# Patient Record
Sex: Female | Born: 1980 | Race: White | Hispanic: No | Marital: Single | Smoking: Current every day smoker
Health system: Southern US, Community
[De-identification: ages and names within clinical notes are randomized; demographics above are authoritative.]

## PROBLEM LIST (undated history)

## (undated) DIAGNOSIS — K8020 Calculus of gallbladder without cholecystitis without obstruction: Secondary | ICD-10-CM

## (undated) DIAGNOSIS — N83209 Unspecified ovarian cyst, unspecified side: Secondary | ICD-10-CM

## (undated) DIAGNOSIS — Z789 Other specified health status: Secondary | ICD-10-CM

## (undated) DIAGNOSIS — F1910 Other psychoactive substance abuse, uncomplicated: Secondary | ICD-10-CM

---
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
## 2002-01-10 ENCOUNTER — Emergency Department (HOSPITAL_COMMUNITY): Admission: EM | Admit: 2002-01-10 | Discharge: 2002-01-10 | Payer: Self-pay | Admitting: Emergency Medicine

## 2002-03-29 ENCOUNTER — Emergency Department (HOSPITAL_COMMUNITY): Admission: EM | Admit: 2002-03-29 | Discharge: 2002-03-29 | Payer: Self-pay | Admitting: Emergency Medicine

## 2002-04-22 ENCOUNTER — Emergency Department (HOSPITAL_COMMUNITY): Admission: EM | Admit: 2002-04-22 | Discharge: 2002-04-23 | Payer: Self-pay | Admitting: *Deleted

## 2002-04-23 ENCOUNTER — Inpatient Hospital Stay (HOSPITAL_COMMUNITY): Admission: EM | Admit: 2002-04-23 | Discharge: 2002-04-25 | Payer: Self-pay | Admitting: *Deleted

## 2002-04-26 ENCOUNTER — Emergency Department (HOSPITAL_COMMUNITY): Admission: EM | Admit: 2002-04-26 | Discharge: 2002-04-27 | Payer: Self-pay

## 2002-05-02 ENCOUNTER — Inpatient Hospital Stay (HOSPITAL_COMMUNITY): Admission: AD | Admit: 2002-05-02 | Discharge: 2002-05-03 | Payer: Self-pay | Admitting: Obstetrics & Gynecology

## 2002-06-06 ENCOUNTER — Ambulatory Visit (HOSPITAL_COMMUNITY): Admission: AD | Admit: 2002-06-06 | Discharge: 2002-06-06 | Payer: Self-pay | Admitting: Obstetrics and Gynecology

## 2002-08-07 ENCOUNTER — Ambulatory Visit (HOSPITAL_COMMUNITY): Admission: AD | Admit: 2002-08-07 | Discharge: 2002-08-07 | Payer: Self-pay | Admitting: Obstetrics and Gynecology

## 2002-08-08 ENCOUNTER — Ambulatory Visit (HOSPITAL_COMMUNITY): Admission: AD | Admit: 2002-08-08 | Discharge: 2002-08-08 | Payer: Self-pay | Admitting: Obstetrics and Gynecology

## 2002-08-19 ENCOUNTER — Ambulatory Visit (HOSPITAL_COMMUNITY): Admission: AD | Admit: 2002-08-19 | Discharge: 2002-08-20 | Payer: Self-pay | Admitting: Obstetrics & Gynecology

## 2002-08-24 ENCOUNTER — Ambulatory Visit (HOSPITAL_COMMUNITY): Admission: AD | Admit: 2002-08-24 | Discharge: 2002-08-24 | Payer: Self-pay | Admitting: Obstetrics and Gynecology

## 2002-09-01 ENCOUNTER — Ambulatory Visit (HOSPITAL_COMMUNITY): Admission: AD | Admit: 2002-09-01 | Discharge: 2002-09-01 | Payer: Self-pay | Admitting: Obstetrics and Gynecology

## 2002-09-20 ENCOUNTER — Observation Stay (HOSPITAL_COMMUNITY): Admission: AD | Admit: 2002-09-20 | Discharge: 2002-09-21 | Payer: Self-pay | Admitting: Obstetrics and Gynecology

## 2002-10-08 ENCOUNTER — Observation Stay (HOSPITAL_COMMUNITY): Admission: AD | Admit: 2002-10-08 | Discharge: 2002-10-09 | Payer: Self-pay | Admitting: Obstetrics and Gynecology

## 2002-10-17 ENCOUNTER — Inpatient Hospital Stay (HOSPITAL_COMMUNITY): Admission: AD | Admit: 2002-10-17 | Discharge: 2002-10-20 | Payer: Self-pay | Admitting: Obstetrics and Gynecology

## 2007-09-29 ENCOUNTER — Emergency Department (HOSPITAL_COMMUNITY): Admission: EM | Admit: 2007-09-29 | Discharge: 2007-09-29 | Payer: Self-pay | Admitting: Emergency Medicine

## 2007-11-22 ENCOUNTER — Emergency Department (HOSPITAL_COMMUNITY): Admission: EM | Admit: 2007-11-22 | Discharge: 2007-11-22 | Payer: Self-pay | Admitting: Emergency Medicine

## 2009-01-20 ENCOUNTER — Emergency Department (HOSPITAL_COMMUNITY): Admission: EM | Admit: 2009-01-20 | Discharge: 2009-01-20 | Payer: Self-pay | Admitting: Emergency Medicine

## 2009-06-17 ENCOUNTER — Emergency Department (HOSPITAL_COMMUNITY): Admission: EM | Admit: 2009-06-17 | Discharge: 2009-06-17 | Payer: Self-pay | Admitting: Emergency Medicine

## 2009-11-12 ENCOUNTER — Emergency Department (HOSPITAL_COMMUNITY): Admission: EM | Admit: 2009-11-12 | Discharge: 2009-11-12 | Payer: Self-pay | Admitting: Emergency Medicine

## 2010-07-09 ENCOUNTER — Emergency Department (HOSPITAL_COMMUNITY)
Admission: EM | Admit: 2010-07-09 | Discharge: 2010-07-09 | Payer: Self-pay | Source: Home / Self Care | Admitting: Emergency Medicine

## 2010-07-24 ENCOUNTER — Emergency Department (HOSPITAL_COMMUNITY): Admission: EM | Admit: 2010-07-24 | Discharge: 2010-07-24 | Payer: Self-pay | Admitting: Emergency Medicine

## 2010-11-17 LAB — POCT PREGNANCY, URINE

## 2010-12-10 LAB — URINALYSIS, ROUTINE W REFLEX MICROSCOPIC
Bilirubin Urine: NEGATIVE
Glucose, UA: NEGATIVE mg/dL
Ketones, ur: NEGATIVE mg/dL
Protein, ur: NEGATIVE mg/dL

## 2010-12-10 LAB — RAPID URINE DRUG SCREEN, HOSP PERFORMED
Amphetamines: NOT DETECTED
Benzodiazepines: NOT DETECTED
Cocaine: POSITIVE — AB
Opiates: NOT DETECTED

## 2010-12-10 LAB — BASIC METABOLIC PANEL
Chloride: 106 mEq/L (ref 96–112)
Creatinine, Ser: 0.81 mg/dL (ref 0.4–1.2)
Sodium: 137 mEq/L (ref 135–145)

## 2010-12-10 LAB — DIFFERENTIAL
Eosinophils Relative: 2 % (ref 0–5)
Lymphocytes Relative: 24 % (ref 12–46)
Lymphs Abs: 2.4 10*3/uL (ref 0.7–4.0)
Neutrophils Relative %: 65 % (ref 43–77)

## 2010-12-10 LAB — CBC
HCT: 43.2 % (ref 36.0–46.0)
Platelets: 284 10*3/uL (ref 150–400)
WBC: 10 10*3/uL (ref 4.0–10.5)

## 2010-12-10 LAB — URINE MICROSCOPIC-ADD ON

## 2010-12-10 LAB — ETHANOL

## 2011-01-22 NOTE — H&P (Signed)
   NAME:  Olivia Mcdaniel, Olivia Mcdaniel                      ACCOUNT NO.:  [id number]   MEDICAL RECORD NO.:  [id number]                   PATIENT TYPE:  INP   LOCATION:  A428                                 FACILITY:  [facility]   PHYSICIAN:  Tilda Burrow, M.D.              DATE OF BIRTH:  1981/04/03   DATE OF ADMISSION:  DATE OF DISCHARGE:                                HISTORY & PHYSICAL   ADMITTING DIAGNOSIS:  Pregnancy at 11 weeks with fever of unknown origin.   HISTORY OF PRESENT ILLNESS:  This patient had several days of malaise,  started feeling really bad, and presented to the emergency room.  At that  time she was admitted up to labor and delivery with a temperature of 101.3.  Pulse was 120.  At that time a CBC was obtained and the patient was admitted  with fever of unknown origin at [redacted] weeks gestation.   ALLERGIES:  She has no known allergies.   PAST MEDICAL HISTORY:  Negative.   PAST SURGICAL HISTORY:  Negative.   MEDICATIONS:  She is on prenatal vitamins.   DIAGNOSIS:  Fever of unknown origin.   PLAN:  CBC a urinalysis, Ancef 1 gm IVq.6h., consult Dr. Christin Bach  regarding treatment of patient and monitor patient's vital signs q.4h.     Zerita Boers, N.M.                      Tilda Burrow, M.D.    DL/MEDQ  D:  29/56/2130  T:  04/25/2002  Job:  [phone] [phone]   cc:   [facility]

## 2011-01-22 NOTE — Op Note (Signed)
   NAME:  Olivia Mcdaniel, Olivia Mcdaniel                       ACCOUNT NO.:  [id number]   MEDICAL RECORD NO.:  [id number]                   PATIENT TYPE:  INP   LOCATION:  LDR2                                 FACILITY:  [facility]   PHYSICIAN:  Tilda Burrow, M.D.              DATE OF BIRTH:  Feb 26, 1981   DATE OF PROCEDURE:  DATE OF DISCHARGE:                                 OPERATIVE REPORT   PROCEDURE:  Epidural catheter placement.   INDICATIONS:  A 30 year old primiparous female being induced at 39 weeks due  to persistent pressure _____ prodromal labor symptoms.   DETAILS OF PROCEDURE:  The patient was placed in the sitting position and  prepped and draped over the back, and a Xylocaine skin wheal raised at L2-3  interspace.  Loss of resistance technique using a 19-gauge Tuohy needle was  used to identify the epidural space at approximately 3.5 cm depth beneath  the skin. A 5 cc test dose with 1.5% Xylocaine with epinephrine was  instilled, followed by placement of the epidural catheter to a depth of 3  cm, removal of the Tuohy needle and taping of the catheter in the patient's  back and then a 5 cc bolus infused with good analgesic levels at 210  identified.  The patient is quite comfortable, and postprocedure exam shows  the cervix to be 4 cm, 50%, at -1 to -2, vertex, mid position cervix.  Scalp  electrode is applied.                                               Tilda Burrow, M.D.    JVF/MEDQ  D:  10/18/2002  T:  10/18/2002  Job:  [id number]   cc:   [facility]

## 2011-01-22 NOTE — H&P (Signed)
   NAME:  Olivia Mcdaniel, Olivia Mcdaniel NO.:  [id number]   MEDICAL RECORD NO.:  [id number]                   PATIENT TYPE:   LOCATION:                                       FACILITY:   PHYSICIAN:  Lazaro Arms, M.D.                DATE OF BIRTH:   DATE OF ADMISSION:  05/02/2002  DATE OF DISCHARGE:                                HISTORY & PHYSICAL   HISTORY OF PRESENT ILLNESS:  The patient is an 30 year old white female,  gravida 1, para 0, with an estimated date of delivery of 10/25/02 by last  menstrual period and confirmatory eight-week sonogram, currently at 14 weeks  6 days' gestation, who presents to the office this afternoon complaining of  not being able to keep anything down for a couple of days and a bilateral  frontal and bitemporal headache and nausea associated with that.  She has a  documented five-pound weight loss over the past two weeks and has weighed a  few times in the office.  As a result, she is admitted to the hospital for  IV hydration.   PAST MEDICAL HISTORY:  Negative.   PAST SURGICAL HISTORY:  Negative.   PAST OBSTETRICAL HISTORY:  Negative.   ALLERGIES:  None.   MEDICATIONS:  Prenatal vitamins.   PHYSICAL EXAMINATION:  ABDOMEN:  Benign, no CVA tenderness.  Fetal heart  rate is 160.  PELVIC:  Deferred.   IMPRESSION:  1. Intrauterine pregnancy at 14 weeks 6 days.  2. Hyperemesis gravidarum.  3. Bilateral frontal headache.   PLAN:  The patient is admitted for IV hydration, NPO, and management of  headache status.  She will probably be in approximately 48 hours or so, and  will keep her NPO for now.                                               Lazaro Arms, M.D.    Loraine Maple  D:  05/02/2002  T:  05/02/2002  Job:  [id number]

## 2011-01-22 NOTE — Discharge Summary (Signed)
   NAME:  Olivia Mcdaniel, Olivia Mcdaniel                      ACCOUNT NO.:  [id number]   MEDICAL RECORD NO.:  [id number]                   PATIENT TYPE:  INP   LOCATION:  A428                                 FACILITY:  [facility]   PHYSICIAN:  Tilda Burrow, M.D.              DATE OF BIRTH:  1981/05/03   DATE OF ADMISSION:  DATE OF DISCHARGE:  04/25/2002                                 DISCHARGE SUMMARY   DISCHARGE HISTORY AND PHYSICAL:   ADMITTING DIAGNOSIS:  Pregnancy at 11 weeks with fever of unknown origin.   DISCHARGE DIAGNOSES:  1. Strep throat.  2. Tonsillitis.   HOSPITAL COURSE:  This patient was admitted on 08/18 at approximately 11  p.m. at night with a fever of 101.3, malaise, body pains, vomiting, and  nausea.  She was placed on IV Ancef.  CBC was obtained and was within normal  limits, but continued to run a temperature of 101-102 for the next 24 hours.  Upon assessment this morning she was noted to have swollen lymph nodes in  the neck, red inflamed tonsils, with white patches and a red throat with  white patches.  Strep culture was obtained.  The patient was given 1.2  million units of penicillin IM.   She has remained afebrile during the course of the day and desires to go  home this afternoon.  It is now 1735 and the patient is to be discharged.  Vital signs have been stable and CBC is within normal limits.   DISCHARGE INSTRUCTIONS:  She is to monitor her temperature and she is to  follow up in the office in 1 week.  She is to use salt water gargles and  Chloraseptic spray for her throat.   DISCHARGE MEDICATIONS:  Her prenatal vitamins.  She has not required any  medication during the course of the day for pain.     Zerita Boers, Reita Cliche, M.D.    DL/MEDQ  D:  16/06/9603  T:  04/25/2002  Job:  [id number]   cc:   [facility]

## 2011-01-22 NOTE — H&P (Signed)
   NAME:  Olivia Mcdaniel, PERRYMAN NO.:  [id number]   MEDICAL RECORD NO.:  [id number]                  PATIENT TYPE:   LOCATION:                                       FACILITY:   PHYSICIAN:  Tilda Burrow, M.D.              DATE OF BIRTH:   DATE OF ADMISSION:  10/17/2002  DATE OF DISCHARGE:                                HISTORY & PHYSICAL   REASON FOR ADMISSION:  Pregnancy at 38 weeks and 6 days for elective  induction of labor due to maternal discomfort.   HISTORY OF PRESENT ILLNESS:  After through discussion of risks and benefits  of elective induction the patient desires an assist on proceeding with  elective induction of labor.   PAST MEDICAL HISTORY:  Medical history is negative.   PAST SURGICAL HISTORY:  Surgical history is negative.   PRENATAL COURSE:  Essentially uneventful.  Blood type is A positive.  Antibody screen is negative.  Pap was within normal limits.  Rubella is not  immune.  VDRL is nonreactive.  Hepatitis B surface antigen negative.  HIV is  negative.  UDS was negative.  GBS is negative.   PHYSICAL EXAMINATION:  VITAL SIGNS:  Physical exam today, weight is 145  pounds.  Blood pressure is 110/58.  ABDOMEN:  Fetal heart tones are 140, strong and regular.  Fundal height is  38 cm.  PELVIC:  Cervix is 1 cm, 50% effaced, minus 2 station.   PLAN:  We are going to admit for Foley bulb induction and Pitocin induction  of labor.     Zerita Boers, Reita Cliche, M.D.    DL/MEDQ  D:  38/75/6433  T:  10/17/2002  Job:  [id number]   cc:   [facility]

## 2011-01-22 NOTE — Discharge Summary (Signed)
   NAME:  Olivia Mcdaniel, Olivia Mcdaniel                       ACCOUNT NO.:  [id number]   MEDICAL RECORD NO.:  [id number]                   PATIENT TYPE:  OIB   LOCATION:  A414                                 FACILITY:  [facility]   PHYSICIAN:  Tilda Burrow, M.D.              DATE OF BIRTH:  12-06-1980   DATE OF ADMISSION:  09/20/2002  DATE OF DISCHARGE:  09/21/2002                                 DISCHARGE SUMMARY   OBSERVATION SUMMARY:  Observation 9 p.m., January 15 to 9 a.m., September 21, 2002 (12 hours).   HISTORY OF PRESENT ILLNESS AND HOSPITAL COURSE:  This 30 year old female,  gravida 1, para 0, LMP Jan 18, 2002, placing menstrual [facility] October 25, 2002, with corresponding ultrasounds x3, is admitted after a pregnancy  course followed through our office and notable for multiple somatic  complaints and regular visits to our office.  The patient presents at 9 p.m.  on September 20, 2002, in apparent extreme discomfort at 35+ weeks' gestation,  complaining of pelvic pressure suprapubic area.  External fetal monitoring  shows a healthy reactive fetal heart rate tracing and mild uterine  irritability, which evolved into indentable mild contraction pattern of  irregular every-1-to-2-minute frequency.  Urinalysis shows evidence of  urinary tract infection.  Cervix was closed.  Due to the severity of her  dramatic presentation a D-dimer was ordered which was mildly elevated at  1.27 with normal range up to 0.48, considered not clinically significant.  The patient has no evidence of bleeding or recent history of trauma or  injury.  The patient will receive IV fluids, two doses of subcutaneous  terbutaline tocolysis, and antibiotic therapy consisting of Ancef IV every  six hours x2 doses.  The following morning the patient was sleeping soundly  with complete resolution of contractions.   DISCHARGE MEDICATIONS:  She is discharged home on the following medications:  1. Macrobid 100 mg p.o. b.i.d. x7  days.  2. Tylenol No. 3 one to two q.4h. p.r.n. pain.   ACTIVITY:  Limited activity through the weekend.   FOLLOW-UP:  Follow up as scheduled in our office in one week.                                               Tilda Burrow, M.D.    JVF/MEDQ  D:  09/21/2002  T:  09/21/2002  Job:  [id number]

## 2011-01-22 NOTE — Op Note (Signed)
NAME:  KRYSTYN, PICKING                       ACCOUNT NO.:  [id number]   MEDICAL RECORD NO.:  [id number]                   PATIENT TYPE:  INP   LOCATION:  A428                                 FACILITY:  [facility]   PHYSICIAN:  Tilda Burrow, M.D.              DATE OF BIRTH:  August 09, 1981   DATE OF PROCEDURE:  DATE OF DISCHARGE:                                 OPERATIVE REPORT   DELIVERY NOTE:  The patient had a small anterior lip at +1 station at  approximately 1900.  The fetal heart rate was within the 160s with variable  decelerations down to the 100s-100s with each contraction, so it was decided  to allow her to push.  After approximately 45 minutes of pushing effort with  no descent whatsoever, the epidural was discontinued.  The fetus was  suspected to be in an ROP position.  After an hour of pushing with minimal  descent, the patient did take a 30-minute break from pushing, after which  the baby was noted to be lower in the pelvis.  She continued to push for  another hour and brought the baby down to crowning position.  A vacuum was  offered, accepted, and utilized to help expedite the delivery due to ROP  position and decreased maternal pushing effort.  After three vacuum attempts  with the Kiwi vacuum popping off, it was discontinued.  A midline episiotomy  was cut.  The patient still had quite poor pushing efforts, so it was about  another 20 minutes before she finally delivered a viable female infant in ROP  position.  The mouth and nose were suctioned with the DeLee on the perineum,  a nuchal cord was reduced, and the baby was delivered at 2133.  No  stimulating efforts were made because of the meconium-stained fluid and the  baby taken directly to the warmer.  The trachea was then suctioned a total  of three times with approximately 6 mL of meconium-stained mucus aspirated.  At that point resuscitative efforts were made on the baby.  Please see the  nursing notes for the  resuscitation.  Apgars were 4 and 8, weight 7 pounds  12 ounces.  The placenta delivered spontaneously after a spontaneous  separation at 2139.  It was inspected and noted to be intact with a three-  vessel cord and meconium stain.  It was sent to pathology.  The vagina was  then inspected and no extensions of the episiotomy were noted.  It was  repaired with a 2-0 Vicryl under epidural anesthesia.  The fundus had some  periods of atony, and Hemabate 125 mcg was given IM.  The uterus then was  firm.  Twenty units of Pitocin diluted in 1000 mL of lactated Ringer's had  already been hung and was infusing rapidly IV.  Estimated blood loss 400 mL.  The epidural catheter was then removed with the blue tip visualized as  being  intact.     Jacklyn Shell, C.N.M.          Tilda Burrow, M.D.    FC/MEDQ  D:  10/19/2002  T:  10/19/2002  Job:  [id number]

## 2011-01-22 NOTE — Discharge Summary (Signed)
   NAME:  PAGE, PUCCIARELLI                       ACCOUNT NO.:  [id number]   MEDICAL RECORD NO.:  [id number]                   PATIENT TYPE:  INP   LOCATION:  A416                                 FACILITY:  [facility]   PHYSICIAN:  Tilda Burrow, M.D.              DATE OF BIRTH:  [birth year]   DATE OF ADMISSION:  05/02/2002  DATE OF DISCHARGE:  05/03/2002                                 DISCHARGE SUMMARY   ADMITTING DIAGNOSIS:  Pregnancy at 14 weeks with nausea and vomiting and  dehydration.   DISCHARGE DIAGNOSES:  Pregnancy at 14 weeks with nausea and vomiting and  dehydration.   HOSPITAL COURSE:  Hospital course essentially uneventful.  The patient  responded well to IV therapy with some Kay Ciel due to a potassium level of  3.3.  She was taking p.o. fluids well, had no nausea, vomiting or headaches  during the course of the night.  She slept well, retained p.o. fluids in the  a.m. and is now ready for discharge.  Vital signs are stable.  The patient  is keeping fluids down well.   PLAN:  We are going to discharge home.   FOLLOWUP:  Follow up in one week at the office.   DISCHARGE MEDICATIONS:  1. Zofran 8 mg p.o. b.i.d.  2. Darvocet-N 100 two p.o. q.4h. p.r.n. headache.     Zerita Boers, Reita Cliche, M.D.    DL/MEDQ  D:  16/06/9603  T:  05/03/2002  Job:  [id number]   cc:   [facility]

## 2012-05-28 ENCOUNTER — Emergency Department (HOSPITAL_COMMUNITY): Payer: Self-pay

## 2012-05-28 ENCOUNTER — Emergency Department (HOSPITAL_COMMUNITY)
Admission: EM | Admit: 2012-05-28 | Discharge: 2012-05-29 | Discharge status: Against Medical Advice | Payer: Self-pay | Attending: Emergency Medicine | Admitting: Emergency Medicine

## 2012-05-28 DIAGNOSIS — T43601A Poisoning by unspecified psychostimulants, accidental (unintentional), initial encounter: Secondary | ICD-10-CM | POA: Insufficient documentation

## 2012-05-28 DIAGNOSIS — T405X1A Poisoning by cocaine, accidental (unintentional), initial encounter: Secondary | ICD-10-CM | POA: Insufficient documentation

## 2012-05-28 DIAGNOSIS — E876 Hypokalemia: Secondary | ICD-10-CM | POA: Insufficient documentation

## 2012-05-28 DIAGNOSIS — R0602 Shortness of breath: Secondary | ICD-10-CM | POA: Insufficient documentation

## 2012-05-28 LAB — COMPREHENSIVE METABOLIC PANEL
CO2: 27 mEq/L (ref 19–32)
Calcium: 9.6 mg/dL (ref 8.4–10.5)
Creatinine, Ser: 0.63 mg/dL (ref 0.50–1.10)
GFR calc Af Amer: >90 mL/min (ref >90)
GFR calc non Af Amer: >90 mL/min (ref >90)
Glucose, Bld: 105 mg/dL — ABNORMAL HIGH (ref 70–99)

## 2012-05-28 LAB — CBC WITH DIFFERENTIAL/PLATELET
Eosinophils Relative: 2 % (ref 0–5)
HCT: 42.3 % (ref 36.0–46.0)
Lymphocytes Relative: 14 % (ref 12–46)
Lymphs Abs: 1.8 10*3/uL (ref 0.7–4.0)
MCV: 92.4 fL (ref 78.0–100.0)
Monocytes Absolute: 1.3 10*3/uL — ABNORMAL HIGH (ref 0.1–1.0)
Monocytes Relative: 11 % (ref 3–12)
RBC: 4.58 MIL/uL (ref 3.87–5.11)
WBC: 12.3 10*3/uL — ABNORMAL HIGH (ref 4.0–10.5)

## 2012-05-28 LAB — PREGNANCY, URINE: Preg Test, Ur: NEGATIVE

## 2012-05-28 LAB — RAPID URINE DRUG SCREEN, HOSP PERFORMED
Amphetamines: NOT DETECTED
Barbiturates: NOT DETECTED

## 2012-05-28 LAB — ETHANOL: Alcohol, Ethyl (B): <11 mg/dL (ref 0–11)

## 2012-05-28 MED ORDER — POTASSIUM CHLORIDE CRYS ER 20 MEQ PO TBCR
40.0000 meq | EXTENDED_RELEASE_TABLET | Freq: Once | ORAL | Status: AC
  Administered 2012-05-28: 40 meq via ORAL
  Filled 2012-05-28: qty 1
  Filled 2012-05-28: qty 2

## 2012-05-28 MED ORDER — ASPIRIN 325 MG PO TABS
325.0000 mg | ORAL_TABLET | Freq: Once | ORAL | Status: AC
  Administered 2012-05-28: 325 mg via ORAL
  Filled 2012-05-28: qty 1

## 2012-05-28 MED ORDER — POTASSIUM CHLORIDE 10 MEQ/100ML IV SOLN
10.0000 meq | INTRAVENOUS | Status: AC
  Administered 2012-05-28 – 2012-05-29 (×3): 10 meq via INTRAVENOUS
  Filled 2012-05-28 (×3): qty 100

## 2012-05-28 NOTE — ED Notes (Signed)
Pt presents with no acute distress.  Per [facility]- Pt ingested 1 gram of cocaine nasal ingestion last at 1630.  #18 LAC saline lock.

## 2012-05-28 NOTE — ED Notes (Signed)
Pt states she bought a gram of coke from an unknown source at appx 1700. Pt states after an hour she begun to feel chest tightness and SOB that she hasn't felt with previous cocaine use. Pt. States she still feels some tightness and is SOB. O2 sats are 98%/RA. Boyfriend at bedside.

## 2012-05-29 LAB — TROPONIN I
Troponin I: <0.3 ng/mL (ref <0.30)
Troponin I: <0.3 ng/mL (ref <0.30)

## 2012-05-29 NOTE — ED Provider Notes (Signed)
Medical screening examination/treatment/procedure(s) were performed by non-physician practitioner and as supervising physician I was immediately available for consultation/collaboration.  I saw the patient with Tiffany the PA.   Olivia Mcdaniel is a 31 y.o. female who took 1 gram of cocaine prior to arrival and subsequently developed substernal chest pain. Chest pain resolved on arrival. EKG nl. CXR nl, CBC, CMP nl except K of 2.7. Trop neg x 1. I discussed at length with patient and her boyfriend that she will require 6 hr trop and potassium supplementation prior to leaving. She received ASA, KDur, and KCl runs. I signed out to Dr. Read Drivers at midnight and told him that she can be discharged if 6 hr trop neg and repeat K nl. Tiffany the PA will carry out the plan.   For detail note, please see the PA note.    Richardean Canal, MD 05/29/12 [phone]

## 2012-05-29 NOTE — ED Notes (Signed)
Pt states her ride is here and she needs to leave  Encouraged pt to stay and get final lab results  Pt refused stating she needed to leave  Neva Seat, [place] notified  Pt signed out AMA after risks were explained to pt

## 2012-05-29 NOTE — ED Provider Notes (Signed)
History     CSN: [id number]  Arrival date & time 05/28/12  2013   First MD Initiated Contact with Patient 05/28/12 2047      Chief Complaint  Patient presents with  . Drug Overdose    1 gram of cocaine nasal ingestion 1630    (Consider location/radiation/quality/duration/timing/severity/associated sxs/prior treatment) HPI  Department by EMS after an overdose of cocaine. She says that she started a whole gram of cocaine and then developed chest pain and was unable to breathe. Upon arriving to the emergency department she was no longer having chest pain, her blood pressure was within normal limits, her oxygen saturation was within normal limits and respirations were within normal limits. She asks to leave but we advise her that she needs to stay and be worked up/ she denies HI or SI and denies using any other substances. No past medical history on file.  No past surgical history on file.  No family history on file.  History  Substance Use Topics  . Smoking status: Not on file  . Smokeless tobacco: Not on file  . Alcohol Use: Not on file    OB History    No data available      Review of Systems   Review of Systems  Gen: no weight loss, fevers, chills, night sweats  Eyes: no discharge or drainage, no occular pain or visual changes  Nose: no epistaxis or rhinorrhea  Mouth: no dental pain, no sore throat  Neck: no neck pain  Lungs:No wheezing, coughing or hemoptysis CV: + chest pain, - palpitations, dependent edema or orthopnea  Abd: no abdominal pain, nausea, vomiting  GU: no dysuria or gross hematuria  MSK:  No abnormalities  Neuro: no headache, no focal neurologic deficits  Skin: no abnormalities Psyche: negative.    Allergies  Review of patient's allergies indicates no known allergies.  Home Medications  No current outpatient prescriptions on file.  BP 90/60  Pulse 76  Temp 99.1 F (37.3 C) (Oral)  Resp 18  Ht 5\' 3"  (1.6 m)  Wt 145 lb (65.772 kg)   BMI 25.69 kg/m2  SpO2 100%  LMP 05/19/2012  Physical Exam  Nursing note and vitals reviewed. Constitutional: She appears well-developed and well-nourished. No distress.  HENT:  Head: Normocephalic and atraumatic.  Mouth/Throat: Dental caries (wide spread tooth decay) present.  Eyes: Pupils are equal, round, and reactive to light.  Neck: Normal range of motion. Neck supple.  Cardiovascular: Normal rate, regular rhythm and normal heart sounds.   No murmur heard. Pulmonary/Chest: Effort normal. No respiratory distress. She has no wheezes. She has no rales.  Abdominal: Soft. She exhibits no distension. There is no tenderness. There is no rebound.  Neurological: She is alert.  Skin: Skin is warm and dry.    ED Course  Procedures (including critical care time)  Labs Reviewed  CBC WITH DIFFERENTIAL - Abnormal; Notable for the following:    WBC 12.3 (*)     Hemoglobin 15.1 (*)     Neutro Abs 9.0 (*)     Monocytes Absolute 1.3 (*)     All other components within normal limits  COMPREHENSIVE METABOLIC PANEL - Abnormal; Notable for the following:    Potassium 2.7 (*)     Glucose, Bld 105 (*)     BUN <3 (*)  REPEATED TO VERIFY   All other components within normal limits  URINE RAPID DRUG SCREEN (HOSP PERFORMED) - Abnormal; Notable for the following:    Cocaine POSITIVE (*)  All other components within normal limits  TROPONIN I  ETHANOL  PREGNANCY, URINE  TROPONIN I  TROPONIN I  POTASSIUM   Dg Chest 2 View  05/28/2012  *RADIOLOGY REPORT*  Clinical Data: 31 year old female with chest pain and shortness of breath.  CHEST - 2 VIEW  Comparison: 09/29/2007.  Findings: Normal cardiac size and mediastinal contours.  Visualized tracheal air column is within normal limits.  Lung volumes are stable and within normal limits.  The lungs remain clear. No acute osseous abnormality identified.  IMPRESSION: Negative, no acute cardiopulmonary abnormality.   Original Report Authenticated By: Harley Hallmark, M.D.      1. Hypokalemia   2. Cocaine overdose       MDM   Date: 05/29/2012  Rate: 77  Rhythm: normal sinus rhythm  QRS Axis: normal  Intervals: normal  ST/T Wave abnormalities: normal  Conduction Disutrbances:none  Narrative Interpretation:   Old EKG Reviewed: none available  CRITICAL CARE Performed by: Dorthula Matas   Total critical care time: 30 min  Critical care time was exclusive of separately billable procedures and treating other patients.  Critical care was necessary to treat or prevent imminent or life-threatening deterioration.  Critical care was time spent personally by me on the following activities: development of treatment plan with patient and/or surrogate as well as nursing, discussions with consultants, evaluation of patient's response to treatment, examination of patient, obtaining history from patient or surrogate, ordering and performing treatments and interventions, ordering and review of laboratory studies, ordering and review of radiographic studies, pulse oximetry and re-evaluation of patient's condition.   Patient has hypokalemia of 2.7 on top of cocaine chest. Patientgiven PO and 2 runs of IV potassium before signing out AMA. She had two negative troponins and negative EKG. Dr. Silverio Lay was involved in the care of this patient, pt sober and stable before leaving.        Dorthula Matas, PA 05/29/12 [phone]

## 2012-05-29 NOTE — ED Provider Notes (Signed)
Medical screening examination/treatment/procedure(s) were performed by non-physician practitioner and as supervising physician I was immediately available for consultation/collaboration.   Hussain Maimone L Murlene Revell, MD 05/29/12 0632 

## 2012-10-05 ENCOUNTER — Emergency Department (HOSPITAL_COMMUNITY)
Admission: EM | Admit: 2012-10-05 | Discharge: 2012-10-06 | Disposition: A | Discharge status: Home / Self Care | Payer: Self-pay | Attending: Emergency Medicine | Admitting: Emergency Medicine

## 2012-10-05 ENCOUNTER — Encounter (HOSPITAL_COMMUNITY): Payer: Self-pay | Admitting: *Deleted

## 2012-10-05 DIAGNOSIS — R079 Chest pain, unspecified: Secondary | ICD-10-CM | POA: Insufficient documentation

## 2012-10-05 DIAGNOSIS — R0602 Shortness of breath: Secondary | ICD-10-CM | POA: Insufficient documentation

## 2012-10-05 DIAGNOSIS — J069 Acute upper respiratory infection, unspecified: Secondary | ICD-10-CM | POA: Insufficient documentation

## 2012-10-05 DIAGNOSIS — R062 Wheezing: Secondary | ICD-10-CM | POA: Insufficient documentation

## 2012-10-05 DIAGNOSIS — J3489 Other specified disorders of nose and nasal sinuses: Secondary | ICD-10-CM | POA: Insufficient documentation

## 2012-10-05 DIAGNOSIS — F1410 Cocaine abuse, uncomplicated: Secondary | ICD-10-CM | POA: Insufficient documentation

## 2012-10-05 NOTE — ED Notes (Addendum)
Two days of cough and congestion, pt texting during conversation

## 2012-10-05 NOTE — ED Notes (Signed)
Pt requesting something to drink, aware she needs to be seen first by the PA

## 2012-10-05 NOTE — ED Provider Notes (Signed)
History  Scribed for Marlon Pel, PA-C/ Raeford Razor, MD, the patient was seen in room WTR7/WTR7. This chart was scribed by Candelaria Stagers. The patient's care started at 11:39 PM   CSN: [id number]  Arrival date & time 10/05/12  2311   None     Chief Complaint  Patient presents with  . Cough     The history is provided by the patient. No language interpreter was used.   Olivia Mcdaniel is a 32 y.o. female who presents to the Emergency Department complaining of cough, SOB, and congestion that started about three days ago.  Pt is also experiencing chest tenderness associated with her cough.  Taking deep breaths makes the pain worse.  She denies nasal congestion, sore throat, fever, or chills.  She has taken nothing for the sx.    History reviewed. No pertinent past medical history.  History reviewed. No pertinent past surgical history.  No family history on file.  History  Substance Use Topics  . Smoking status: Not on file  . Smokeless tobacco: Not on file  . Alcohol Use: Yes    OB History    Grav Para Term Preterm Abortions TAB SAB Ect Mult Living                  Review of Systems  Constitutional: Negative for fever and chills.  HENT: Positive for congestion. Negative for sore throat.   Respiratory: Positive for cough and shortness of breath.   Cardiovascular: Positive for chest pain (associated with cough ).  Gastrointestinal: Negative for nausea and vomiting.  Skin: Negative for rash.  Neurological: Negative for weakness.  All other systems reviewed and are negative.    Allergies  Review of patient's allergies indicates no known allergies.  Home Medications   Current Outpatient Rx  Name  Route  Sig  Dispense  Refill  . GUAIFENESIN-DM 100-10 MG/5ML PO SYRP   Oral   Take 5 mLs by mouth 3 (three) times daily as needed for cough.   118 mL   0     BP 108/69  Pulse 84  Temp 98.8 F (37.1 C) (Oral)  Resp 20  SpO2 100%  Physical Exam  Nursing  note and vitals reviewed. Constitutional: She is oriented to person, place, and time. She appears well-developed and well-nourished. No distress.  HENT:  Head: Normocephalic and atraumatic.  Eyes: EOM are normal.  Neck: Neck supple. No tracheal deviation present.  Cardiovascular: Normal rate and regular rhythm.   Pulmonary/Chest: Effort normal. No respiratory distress. She has wheezes (very mild wheezes on expiration).  Abdominal: Soft. There is no tenderness.  Musculoskeletal: Normal range of motion.  Neurological: She is alert and oriented to person, place, and time.  Skin: Skin is warm and dry.  Psychiatric: She has a normal mood and affect. Her behavior is normal.    ED Course  Procedures  DIAGNOSTIC STUDIES: Oxygen Saturation is 100% on room air, normal by my interpretation.    COORDINATION OF CARE:   11:40 PM Will prescribe albuterol inhaler and cough medicine.  Pt understands and agrees.    Labs Reviewed - No data to display No results found.   1. URI (upper respiratory infection)       MDM  Pt requests inhaler and medication to help her sleep.  Will give her Albuterol inhaler in ED and Robitussin DM. She ha a history of drug abuse so I want to stay away from narcotics or sleep aids.  Pt exam  is benign  I personally performed the services described in this documentation, which was scribed in my presence. The recorded information has been reviewed and is accurate.        Dorthula Matas, PA 10/06/12 0006

## 2012-10-06 MED ORDER — ALBUTEROL SULFATE HFA 108 (90 BASE) MCG/ACT IN AERS
2.0000 | INHALATION_SPRAY | Freq: Four times a day (QID) | RESPIRATORY_TRACT | Status: DC
  Administered 2012-10-06: 2 via RESPIRATORY_TRACT
  Filled 2012-10-06: qty 6.7

## 2012-10-06 MED ORDER — GUAIFENESIN 100 MG/5ML PO SOLN
5.0000 mL | Freq: Once | ORAL | Status: AC
  Administered 2012-10-06: 100 mg via ORAL
  Filled 2012-10-06: qty 5

## 2012-10-06 MED ORDER — GUAIFENESIN-DM 100-10 MG/5ML PO SYRP: 5.0000 mL | ORAL_SOLUTION | Freq: Three times a day (TID) | ORAL | Status: DC | PRN

## 2012-10-06 NOTE — ED Provider Notes (Signed)
Medical screening examination/treatment/procedure(s) were performed by non-physician practitioner and as supervising physician I was immediately available for consultation/collaboration.  Channah Godeaux M Damon Hargrove, MD 10/06/12 0738 

## 2012-10-15 ENCOUNTER — Emergency Department (HOSPITAL_COMMUNITY): Payer: Self-pay

## 2012-10-15 ENCOUNTER — Emergency Department (HOSPITAL_COMMUNITY)
Admission: EM | Admit: 2012-10-15 | Discharge: 2012-10-15 | Disposition: A | Discharge status: Home / Self Care | Payer: Self-pay | Attending: Emergency Medicine | Admitting: Emergency Medicine

## 2012-10-15 ENCOUNTER — Encounter (HOSPITAL_COMMUNITY): Payer: Self-pay | Admitting: Family Medicine

## 2012-10-15 DIAGNOSIS — F1410 Cocaine abuse, uncomplicated: Secondary | ICD-10-CM | POA: Insufficient documentation

## 2012-10-15 DIAGNOSIS — Z79899 Other long term (current) drug therapy: Secondary | ICD-10-CM | POA: Insufficient documentation

## 2012-10-15 DIAGNOSIS — F14129 Cocaine abuse with intoxication, unspecified: Secondary | ICD-10-CM

## 2012-10-15 DIAGNOSIS — R51 Headache: Secondary | ICD-10-CM | POA: Insufficient documentation

## 2012-10-15 LAB — CBC WITH DIFFERENTIAL/PLATELET
Basophils Absolute: 0 10*3/uL (ref 0.0–0.1)
Lymphocytes Relative: 15 % (ref 12–46)
Lymphs Abs: 1.5 10*3/uL (ref 0.7–4.0)
Neutrophils Relative %: 76 % (ref 43–77)
Platelets: 349 10*3/uL (ref 150–400)
RBC: 4.22 MIL/uL (ref 3.87–5.11)
RDW: 14.1 % (ref 11.5–15.5)
WBC: 9.9 10*3/uL (ref 4.0–10.5)

## 2012-10-15 LAB — BASIC METABOLIC PANEL
Calcium: 9 mg/dL (ref 8.4–10.5)
Creatinine, Ser: 0.58 mg/dL (ref 0.50–1.10)
GFR calc Af Amer: >90 mL/min (ref >90)
GFR calc non Af Amer: >90 mL/min (ref >90)
Sodium: 136 mEq/L (ref 135–145)

## 2012-10-15 LAB — POCT I-STAT TROPONIN I: Troponin i, poc: 0 ng/mL (ref 0.00–0.08)

## 2012-10-15 MED ORDER — LORAZEPAM 2 MG/ML IJ SOLN
1.0000 mg | Freq: Once | INTRAMUSCULAR | Status: AC
  Administered 2012-10-15: 1 mg via INTRAVENOUS
  Filled 2012-10-15: qty 1

## 2012-10-15 MED ORDER — ALBUTEROL SULFATE HFA 108 (90 BASE) MCG/ACT IN AERS: 2.0000 | INHALATION_SPRAY | Freq: Four times a day (QID) | RESPIRATORY_TRACT | Status: AC | PRN

## 2012-10-15 MED ORDER — ACETAMINOPHEN 325 MG PO TABS
650.0000 mg | ORAL_TABLET | Freq: Once | ORAL | Status: AC
  Administered 2012-10-15: 650 mg via ORAL
  Filled 2012-10-15: qty 2

## 2012-10-15 NOTE — ED Notes (Addendum)
Upon entering the room pt was sleeping on right side. Pt was awaken for introduction and pain assessment. Pt states her whole body hurts and was hurting before she came into the ED today. Pt was in no apparent distress and delay explained.

## 2012-10-15 NOTE — ED Notes (Signed)
Per EMS, pt had some crack/cocaine earlier today and a few hours later started having chest pain. Chest pain was relieved upon arrival here but she is complaining of headache.

## 2012-10-15 NOTE — ED Provider Notes (Signed)
History     CSN: [id number]  Arrival date & time 10/15/12  1751   First MD Initiated Contact with Patient 10/15/12 1814      Chief Complaint  Patient presents with  . Chest Pain    (Consider location/radiation/quality/duration/timing/severity/associated sxs/prior treatment) Patient is a 32 y.o. female presenting with chest pain. The history is provided by the patient.  Chest Pain Pain location:  Substernal area Pain quality: aching   Pain radiates to:  Does not radiate Pain radiates to the back: no   Pain severity:  Mild Onset quality:  Gradual Timing:  Constant Progression:  Unchanged Chronicity:  New Context: drug use   Relieved by:  Nothing Worsened by:  Nothing tried Ineffective treatments:  None tried Associated symptoms: no abdominal pain, no back pain, no fatigue, no fever, no headache, no nausea, no palpitations, no shortness of breath, not vomiting and no weakness   Associated symptoms comment:  "pain all over." Risk factors comment:  Crack cocaine use   History reviewed. No pertinent past medical history.  History reviewed. No pertinent past surgical history.  History reviewed. No pertinent family history.  History  Substance Use Topics  . Smoking status: Not on file  . Smokeless tobacco: Not on file  . Alcohol Use: Yes    OB History   Grav Para Term Preterm Abortions TAB SAB Ect Mult Living                  Review of Systems  Constitutional: Negative for fever and fatigue.  HENT: Negative for congestion, rhinorrhea and postnasal drip.   Eyes: Negative for photophobia and visual disturbance.  Respiratory: Negative for chest tightness, shortness of breath and wheezing.   Cardiovascular: Positive for chest pain. Negative for palpitations and leg swelling.  Gastrointestinal: Negative for nausea, vomiting, abdominal pain and diarrhea.  Genitourinary: Negative for urgency, frequency and difficulty urinating.  Musculoskeletal: Negative for back pain  and arthralgias.  Skin: Negative for rash and wound.  Neurological: Negative for weakness and headaches.  Psychiatric/Behavioral: Negative for confusion and agitation.    Allergies  Review of patient's allergies indicates no known allergies.  Home Medications   Current Outpatient Rx  Name  Route  Sig  Dispense  Refill  . guaiFENesin-dextromethorphan (ROBITUSSIN DM) 100-10 MG/5ML syrup   Oral   Take 5 mLs by mouth 3 (three) times daily as needed for cough.   118 mL   0   . albuterol (PROVENTIL HFA;VENTOLIN HFA) 108 (90 BASE) MCG/ACT inhaler   Inhalation   Inhale 2 puffs into the lungs every 6 (six) hours as needed for wheezing. For shortness of breath   1 Inhaler   2     BP 93/62  Pulse 83  Temp(Src) 98.7 F (37.1 C) (Oral)  Resp 19  SpO2 100%  LMP 10/05/2012  Physical Exam  Nursing note and vitals reviewed. Constitutional: She is oriented to person, place, and time. She appears well-developed and well-nourished. No distress.  HENT:  Head: Normocephalic and atraumatic.  Mouth/Throat: Oropharynx is clear and moist.  Eyes: EOM are normal. Pupils are equal, round, and reactive to light.  Neck: Normal range of motion. Neck supple.  Cardiovascular: Normal rate, regular rhythm, normal heart sounds and intact distal pulses.   Pulmonary/Chest: Effort normal and breath sounds normal. She has no wheezes. She has no rales.  Abdominal: Soft. Bowel sounds are normal. She exhibits no distension. There is no tenderness. There is no rebound and no guarding.  Musculoskeletal:  Normal range of motion. She exhibits no edema and no tenderness.  Lymphadenopathy:    She has no cervical adenopathy.  Neurological: She is alert and oriented to person, place, and time. She displays normal reflexes. No cranial nerve deficit. She exhibits normal muscle tone. Coordination normal.  Skin: Skin is warm and dry. No rash noted.  Psychiatric: She has a normal mood and affect. Her behavior is normal.     ED Course  Procedures (including critical care time)   Date: 10/16/2012  Rate: 85  Rhythm: normal sinus rhythm  QRS Axis: normal  Intervals: normal  ST/T Wave abnormalities: normal  Conduction Disutrbances:none  Narrative Interpretation:   Old EKG Reviewed: unchanged    Labs Reviewed  BASIC METABOLIC PANEL - Abnormal; Notable for the following:    Potassium 3.4 (*)    BUN <3 (*)    All other components within normal limits  CBC WITH DIFFERENTIAL - Abnormal; Notable for the following:    MCH 34.6 (*)    MCHC 36.4 (*)    All other components within normal limits  POCT I-STAT TROPONIN I   Dg Chest Portable 1 View  10/15/2012  *RADIOLOGY REPORT*  Clinical Data: Shortness of breath, chest pain, cough and congestion, smoking history  PORTABLE CHEST - 1 VIEW  Comparison: Chest x-ray of 05/28/2012  Findings: The lungs are not well aerated with mild basilar volume loss present.  Mediastinal contours appear stable.  The heart is within normal limits in size.  IMPRESSION: Mild basilar volume loss.  No active lung disease.   Original Report Authenticated By: Dwyane Dee, M.D.      1. Cocaine abuse with intoxication       MDM  77F here with chest pain after using crack cocaine about 6 hours prior to arrival. She denies shortness of breath, vomiting, fevers, or palpitations. Exam as noted above. Not tachycardic and normotensive. Given Ativan 1 mg x 1 on arrival. EKG without ischemic changes. Initial troponin negative. Pt has no other medical problems. Doubt ACS, PE, dissection. Symptoms related to cocaine toxicity. Felt stable for d/c home. Counseled on cessation of drug use. Return precautions given.        Johnnette Gourd, MD 10/16/12 [phone]

## 2012-10-17 NOTE — ED Provider Notes (Signed)
I have personally seen and examined the patient.  I have discussed the plan of care with the resident.  I have reviewed the documentation on PMH/FH/Soc. History.  I have reviewed the documentation of the resident and agree.  I have reviewed and agree with the ECG interpretation(s) documented by the resident.  Pt well appearing, no distress on my exam.  Stable for d/c     Joya Gaskins, MD 10/17/12 1100

## 2015-04-01 ENCOUNTER — Emergency Department (HOSPITAL_COMMUNITY): Payer: Self-pay

## 2015-04-01 ENCOUNTER — Emergency Department (HOSPITAL_COMMUNITY)
Admission: EM | Admit: 2015-04-01 | Discharge: 2015-04-01 | Disposition: A | Discharge status: Home / Self Care | Payer: Self-pay | Attending: Emergency Medicine | Admitting: Emergency Medicine

## 2015-04-01 ENCOUNTER — Encounter (HOSPITAL_COMMUNITY): Payer: Self-pay | Admitting: *Deleted

## 2015-04-01 DIAGNOSIS — Y9289 Other specified places as the place of occurrence of the external cause: Secondary | ICD-10-CM | POA: Insufficient documentation

## 2015-04-01 DIAGNOSIS — Y998 Other external cause status: Secondary | ICD-10-CM | POA: Insufficient documentation

## 2015-04-01 DIAGNOSIS — S93402A Sprain of unspecified ligament of left ankle, initial encounter: Secondary | ICD-10-CM

## 2015-04-01 DIAGNOSIS — Y9389 Activity, other specified: Secondary | ICD-10-CM | POA: Insufficient documentation

## 2015-04-01 DIAGNOSIS — W109XXA Fall (on) (from) unspecified stairs and steps, initial encounter: Secondary | ICD-10-CM | POA: Insufficient documentation

## 2015-04-01 DIAGNOSIS — Z720 Tobacco use: Secondary | ICD-10-CM | POA: Insufficient documentation

## 2015-04-01 DIAGNOSIS — Z79899 Other long term (current) drug therapy: Secondary | ICD-10-CM | POA: Insufficient documentation

## 2015-04-01 DIAGNOSIS — Z880 Allergy status to penicillin: Secondary | ICD-10-CM | POA: Insufficient documentation

## 2015-04-01 DIAGNOSIS — S90512A Abrasion, left ankle, initial encounter: Secondary | ICD-10-CM | POA: Insufficient documentation

## 2015-04-01 MED ORDER — HYDROCODONE-ACETAMINOPHEN 5-325 MG PO TABS: 2.0000 | ORAL_TABLET | ORAL | Status: DC | PRN

## 2015-04-01 MED ORDER — IBUPROFEN 800 MG PO TABS: 800.0000 mg | ORAL_TABLET | Freq: Three times a day (TID) | ORAL | Status: DC

## 2015-04-01 MED ORDER — HYDROCODONE-ACETAMINOPHEN 5-325 MG PO TABS
2.0000 | ORAL_TABLET | Freq: Once | ORAL | Status: AC
  Administered 2015-04-01: 2 via ORAL
  Filled 2015-04-01: qty 2

## 2015-04-01 NOTE — Discharge Instructions (Signed)

## 2015-04-01 NOTE — ED Provider Notes (Signed)
CSN: [id number]     Arrival date & time 04/01/15  1702 History   This chart was scribed for Langston Masker, PA-C working with Mancel Bale, MD by Elveria Rising, ED Scribe. This patient was seen in room TR02C/TR02C and the patient's care was started at 6:27 PM.   Chief Complaint  Patient presents with  . Ankle Injury   The history is provided by the patient. No language interpreter was used.   HPI Comments: Olivia Mcdaniel is a 34 y.o. female who presents to the Emergency Department with a left ankle injury sustained during mechanical fall from stairs yesterday and landing directly on the ankle. Patient complains of severe pain and swelling to the ankle and small abrasion to anterior surface. Patient is unable to ambulate or bear weight due to pain severity.   History reviewed. No pertinent past medical history. History reviewed. No pertinent past surgical history. No family history on file. History  Substance Use Topics  . Smoking status: Current Every Day Smoker -- 2.00 packs/day    Types: Cigarettes  . Smokeless tobacco: Not on file  . Alcohol Use: Yes   OB History    No data available     Review of Systems  Constitutional: Negative for fever.  Musculoskeletal: Positive for joint swelling and arthralgias.  Skin: Positive for color change and wound.  Neurological: Negative for syncope, weakness and numbness.  All other systems reviewed and are negative.   Allergies  Penicillins  Home Medications   Prior to Admission medications   Medication Sig Start Date End Date Taking? Authorizing Provider  albuterol (PROVENTIL HFA;VENTOLIN HFA) 108 (90 BASE) MCG/ACT inhaler Inhale 2 puffs into the lungs every 6 (six) hours as needed for wheezing. For shortness of breath 10/15/12   Johnnette Gourd, MD  guaiFENesin-dextromethorphan [facility] DM) 100-10 MG/5ML syrup Take 5 mLs by mouth 3 (three) times daily as needed for cough. 10/06/12   Marlon Pel, PA-C   .Triage Vitals: BP 95/68 mmHg   Pulse 107  Temp(Src) 98.2 F (36.8 C) (Oral)  Resp 16  SpO2 97%  LMP 03/07/2015 Physical Exam  Constitutional: She is oriented to person, place, and time. She appears well-developed and well-nourished. No distress.  HENT:  Head: Normocephalic and atraumatic.  Eyes: EOM are normal.  Neck: Neck supple. No tracheal deviation present.  Cardiovascular: Normal rate.   Pulmonary/Chest: Effort normal. No respiratory distress.  Musculoskeletal: She exhibits edema and tenderness.  Swollen, left ankle with superficial 1 cm abrasion. Pain with ROM. Edematous.   Neurological: She is alert and oriented to person, place, and time.  Skin: Skin is warm and dry.  Psychiatric: She has a normal mood and affect. Her behavior is normal.  Nursing note and vitals reviewed.   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 Discussed treatment plan with patient at bedside and patient agreed to plan.   Labs Review Labs Reviewed - No data to display  Imaging Review Dg Ankle Complete Left  04/01/2015   CLINICAL DATA:  Fall today with left ankle injury, pain and swelling. Initial encounter.  EXAM: LEFT ANKLE COMPLETE - 3+ VIEW  COMPARISON:  None.  FINDINGS: There is no evidence of fracture, subluxation or dislocation.  Lateral soft tissue swelling is present.  There may be a small ankle effusion.  The talar dome is intact.  IMPRESSION: Soft tissue swelling and possible small ankle effusion. No evidence of  bony abnormality.   Electronically Signed   By: Harmon Pier M.D.   On: 04/01/2015 17:39     EKG Interpretation None      MDM   Final diagnoses:  Ankle sprain, left, initial encounter   aso Crutches Hydrocodone Ibuprofen Follow up with Dr. Dion Saucier for recheck in 1 week   Lonia Skinner [place], PA-C 04/02/15 0037  Mancel Bale, MD 04/04/15 [phone]

## 2015-04-01 NOTE — ED Notes (Signed)
Ortho tech paged st's he is too busy at this time to apply splint

## 2015-04-01 NOTE — ED Notes (Signed)
Pt st's she fell down steps yesterday and injured left ankle.  St's she did not have transportation here until today.  Pt requesting something to eat and drink

## 2015-04-01 NOTE — ED Notes (Signed)
Pt state that she fell down stairs yesterday and thinks she broke her left ankle. States she has been walking on it. Swelling noted.

## 2015-06-17 ENCOUNTER — Encounter (HOSPITAL_COMMUNITY): Admission: EM | Disposition: A | Discharge status: Home / Self Care | Payer: Self-pay | Source: Home / Self Care

## 2015-06-17 ENCOUNTER — Inpatient Hospital Stay (HOSPITAL_COMMUNITY)
Admission: EM | Admit: 2015-06-17 | Discharge: 2015-06-18 | DRG: 419 | Disposition: A | Discharge status: Home / Self Care | Payer: Self-pay | Attending: General Surgery | Admitting: General Surgery

## 2015-06-17 ENCOUNTER — Encounter (HOSPITAL_COMMUNITY): Payer: Self-pay | Admitting: Emergency Medicine

## 2015-06-17 ENCOUNTER — Inpatient Hospital Stay (HOSPITAL_COMMUNITY): Payer: Self-pay | Admitting: Anesthesiology

## 2015-06-17 ENCOUNTER — Inpatient Hospital Stay (HOSPITAL_COMMUNITY): Payer: MEDICAID | Admitting: Anesthesiology

## 2015-06-17 ENCOUNTER — Emergency Department (HOSPITAL_COMMUNITY): Payer: Self-pay

## 2015-06-17 DIAGNOSIS — K819 Cholecystitis, unspecified: Secondary | ICD-10-CM | POA: Diagnosis present

## 2015-06-17 DIAGNOSIS — F17210 Nicotine dependence, cigarettes, uncomplicated: Secondary | ICD-10-CM | POA: Diagnosis present

## 2015-06-17 DIAGNOSIS — F1410 Cocaine abuse, uncomplicated: Secondary | ICD-10-CM | POA: Diagnosis present

## 2015-06-17 DIAGNOSIS — K8010 Calculus of gallbladder with chronic cholecystitis without obstruction: Principal | ICD-10-CM | POA: Diagnosis present

## 2015-06-17 DIAGNOSIS — K810 Acute cholecystitis: Secondary | ICD-10-CM

## 2015-06-17 HISTORY — DX: Calculus of gallbladder without cholecystitis without obstruction: K80.20

## 2015-06-17 HISTORY — PX: CHOLECYSTECTOMY: SHX55

## 2015-06-17 LAB — URINALYSIS, ROUTINE W REFLEX MICROSCOPIC
Bilirubin Urine: NEGATIVE
Glucose, UA: NEGATIVE mg/dL
Hgb urine dipstick: NEGATIVE
KETONES UR: NEGATIVE mg/dL
LEUKOCYTES UA: NEGATIVE
NITRITE: NEGATIVE
PROTEIN: NEGATIVE mg/dL
Specific Gravity, Urine: 1.003 — ABNORMAL LOW (ref 1.005–1.030)
UROBILINOGEN UA: 0.2 mg/dL (ref 0.0–1.0)
pH: 6.5 (ref 5.0–8.0)

## 2015-06-17 LAB — COMPREHENSIVE METABOLIC PANEL
ALT: 7 U/L — AB (ref 14–54)
AST: 23 U/L (ref 15–41)
Albumin: 3.5 g/dL (ref 3.5–5.0)
Alkaline Phosphatase: 63 U/L (ref 38–126)
Anion gap: 8 (ref 5–15)
BUN: <5 mg/dL — ABNORMAL LOW (ref 6–20)
CHLORIDE: 101 mmol/L (ref 101–111)
CO2: 25 mmol/L (ref 22–32)
CREATININE: 0.74 mg/dL (ref 0.44–1.00)
Calcium: 8.4 mg/dL — ABNORMAL LOW (ref 8.9–10.3)
Glucose, Bld: 111 mg/dL — ABNORMAL HIGH (ref 65–99)
POTASSIUM: 3.6 mmol/L (ref 3.5–5.1)
SODIUM: 134 mmol/L — AB (ref 135–145)
Total Bilirubin: 1 mg/dL (ref 0.3–1.2)
Total Protein: 6.6 g/dL (ref 6.5–8.1)

## 2015-06-17 LAB — CBC
HEMATOCRIT: 40.9 % (ref 36.0–46.0)
HEMOGLOBIN: 13.9 g/dL (ref 12.0–15.0)
MCH: 33 pg (ref 26.0–34.0)
MCHC: 34 g/dL (ref 30.0–36.0)
MCV: 97.1 fL (ref 78.0–100.0)
Platelets: 343 10*3/uL (ref 150–400)
RBC: 4.21 MIL/uL (ref 3.87–5.11)
RDW: 13.1 % (ref 11.5–15.5)
WBC: 12.3 10*3/uL — ABNORMAL HIGH (ref 4.0–10.5)

## 2015-06-17 LAB — LIPASE, BLOOD: LIPASE: 17 U/L — AB (ref 22–51)

## 2015-06-17 LAB — POC URINE PREG, ED: Preg Test, Ur: NEGATIVE

## 2015-06-17 SURGERY — LAPAROSCOPIC CHOLECYSTECTOMY
Anesthesia: General | Site: Abdomen

## 2015-06-17 MED ORDER — SUCCINYLCHOLINE CHLORIDE 20 MG/ML IJ SOLN
INTRAMUSCULAR | Status: DC | PRN
  Administered 2015-06-17: 100 mg via INTRAVENOUS

## 2015-06-17 MED ORDER — PROPOFOL 10 MG/ML IV BOLUS
INTRAVENOUS | Status: DC | PRN
  Administered 2015-06-17: 150 mg via INTRAVENOUS

## 2015-06-17 MED ORDER — INFLUENZA VAC SPLIT QUAD 0.5 ML IM SUSY
0.5000 mL | PREFILLED_SYRINGE | INTRAMUSCULAR | Status: AC
  Administered 2015-06-18: 0.5 mL via INTRAMUSCULAR
  Filled 2015-06-17: qty 0.5

## 2015-06-17 MED ORDER — HYDROMORPHONE HCL 1 MG/ML IJ SOLN
INTRAMUSCULAR | Status: AC
  Filled 2015-06-17: qty 1

## 2015-06-17 MED ORDER — GLYCOPYRROLATE 0.2 MG/ML IJ SOLN
INTRAMUSCULAR | Status: AC
  Filled 2015-06-17: qty 2

## 2015-06-17 MED ORDER — SODIUM CHLORIDE 0.9 % IV SOLN
INTRAVENOUS | Status: DC
  Administered 2015-06-17: 06:00:00 via INTRAVENOUS

## 2015-06-17 MED ORDER — HYDROMORPHONE HCL 1 MG/ML IJ SOLN
0.2500 mg | INTRAMUSCULAR | Status: DC | PRN
  Administered 2015-06-17 (×2): 0.5 mg via INTRAVENOUS

## 2015-06-17 MED ORDER — ONDANSETRON HCL 4 MG/2ML IJ SOLN
4.0000 mg | Freq: Four times a day (QID) | INTRAMUSCULAR | Status: DC | PRN
  Administered 2015-06-17 – 2015-06-18 (×4): 4 mg via INTRAVENOUS
  Filled 2015-06-17 (×4): qty 2

## 2015-06-17 MED ORDER — ONDANSETRON HCL 4 MG/2ML IJ SOLN
4.0000 mg | Freq: Once | INTRAMUSCULAR | Status: AC
  Administered 2015-06-17: 4 mg via INTRAVENOUS
  Filled 2015-06-17: qty 2

## 2015-06-17 MED ORDER — PANTOPRAZOLE SODIUM 40 MG IV SOLR
40.0000 mg | Freq: Every day | INTRAVENOUS | Status: DC
  Administered 2015-06-17: 40 mg via INTRAVENOUS
  Filled 2015-06-17: qty 40

## 2015-06-17 MED ORDER — FENTANYL CITRATE (PF) 250 MCG/5ML IJ SOLN
INTRAMUSCULAR | Status: AC
  Filled 2015-06-17: qty 5

## 2015-06-17 MED ORDER — ONDANSETRON HCL 4 MG/2ML IJ SOLN
INTRAMUSCULAR | Status: AC
  Filled 2015-06-17: qty 2

## 2015-06-17 MED ORDER — ONDANSETRON 4 MG PO TBDP
4.0000 mg | ORAL_TABLET | Freq: Four times a day (QID) | ORAL | Status: DC | PRN
  Administered 2015-06-18: 4 mg via ORAL
  Filled 2015-06-17: qty 1

## 2015-06-17 MED ORDER — LACTATED RINGERS IV SOLN
INTRAVENOUS | Status: DC
  Administered 2015-06-17 (×3): via INTRAVENOUS

## 2015-06-17 MED ORDER — GLYCOPYRROLATE 0.2 MG/ML IJ SOLN
INTRAMUSCULAR | Status: DC | PRN
  Administered 2015-06-17: 0.4 mg via INTRAVENOUS

## 2015-06-17 MED ORDER — SODIUM CHLORIDE 0.9 % IV BOLUS (SEPSIS)
500.0000 mL | Freq: Once | INTRAVENOUS | Status: AC
  Administered 2015-06-17: 500 mL via INTRAVENOUS

## 2015-06-17 MED ORDER — HYDROCODONE-ACETAMINOPHEN 5-325 MG PO TABS
1.0000 | ORAL_TABLET | ORAL | Status: DC | PRN
  Administered 2015-06-17 – 2015-06-18 (×5): 2 via ORAL
  Filled 2015-06-17 (×5): qty 2

## 2015-06-17 MED ORDER — ROCURONIUM BROMIDE 100 MG/10ML IV SOLN
INTRAVENOUS | Status: DC | PRN
  Administered 2015-06-17: 20 mg via INTRAVENOUS

## 2015-06-17 MED ORDER — BUPIVACAINE-EPINEPHRINE 0.25% -1:200000 IJ SOLN
INTRAMUSCULAR | Status: DC | PRN
  Administered 2015-06-17: 10 mL

## 2015-06-17 MED ORDER — ACETAMINOPHEN 650 MG RE SUPP: 650.0000 mg | Freq: Four times a day (QID) | RECTAL | Status: DC | PRN

## 2015-06-17 MED ORDER — SODIUM CHLORIDE 0.9 % IR SOLN
Status: DC | PRN
  Administered 2015-06-17: 1000 mL

## 2015-06-17 MED ORDER — LIDOCAINE HCL (CARDIAC) 20 MG/ML IV SOLN
INTRAVENOUS | Status: AC
  Filled 2015-06-17: qty 5

## 2015-06-17 MED ORDER — ROCURONIUM BROMIDE 50 MG/5ML IV SOLN
INTRAVENOUS | Status: AC
  Filled 2015-06-17: qty 1

## 2015-06-17 MED ORDER — PNEUMOCOCCAL VAC POLYVALENT 25 MCG/0.5ML IJ INJ
0.5000 mL | INJECTION | INTRAMUSCULAR | Status: AC
  Administered 2015-06-18: 0.5 mL via INTRAMUSCULAR
  Filled 2015-06-17: qty 0.5

## 2015-06-17 MED ORDER — HYDROMORPHONE HCL 1 MG/ML IJ SOLN
1.0000 mg | INTRAMUSCULAR | Status: DC | PRN
  Administered 2015-06-17 – 2015-06-18 (×3): 1 mg via INTRAVENOUS
  Filled 2015-06-17 (×4): qty 1

## 2015-06-17 MED ORDER — 0.9 % SODIUM CHLORIDE (POUR BTL) OPTIME
TOPICAL | Status: DC | PRN
  Administered 2015-06-17: 1000 mL

## 2015-06-17 MED ORDER — LIDOCAINE HCL (CARDIAC) 20 MG/ML IV SOLN
INTRAVENOUS | Status: DC | PRN
  Administered 2015-06-17: 40 mg via INTRAVENOUS

## 2015-06-17 MED ORDER — PROPOFOL 10 MG/ML IV BOLUS
INTRAVENOUS | Status: AC
  Filled 2015-06-17: qty 20

## 2015-06-17 MED ORDER — ENOXAPARIN SODIUM 40 MG/0.4ML ~~LOC~~ SOLN
40.0000 mg | SUBCUTANEOUS | Status: DC
  Administered 2015-06-18: 40 mg via SUBCUTANEOUS
  Filled 2015-06-17 (×2): qty 0.4

## 2015-06-17 MED ORDER — SODIUM CHLORIDE 0.9 % IV SOLN
INTRAVENOUS | Status: DC
  Administered 2015-06-17 – 2015-06-18 (×2): via INTRAVENOUS

## 2015-06-17 MED ORDER — MIDAZOLAM HCL 2 MG/2ML IJ SOLN
INTRAMUSCULAR | Status: AC
  Filled 2015-06-17: qty 4

## 2015-06-17 MED ORDER — FENTANYL CITRATE (PF) 100 MCG/2ML IJ SOLN
INTRAMUSCULAR | Status: DC | PRN
  Administered 2015-06-17: 100 ug via INTRAVENOUS
  Administered 2015-06-17: 50 ug via INTRAVENOUS
  Administered 2015-06-17 (×3): 100 ug via INTRAVENOUS

## 2015-06-17 MED ORDER — NEOSTIGMINE METHYLSULFATE 10 MG/10ML IV SOLN
INTRAVENOUS | Status: DC | PRN
  Administered 2015-06-17: 3 mg via INTRAVENOUS

## 2015-06-17 MED ORDER — CIPROFLOXACIN IN D5W 400 MG/200ML IV SOLN
400.0000 mg | Freq: Two times a day (BID) | INTRAVENOUS | Status: DC
  Administered 2015-06-17 – 2015-06-18 (×3): 400 mg via INTRAVENOUS
  Filled 2015-06-17 (×5): qty 200

## 2015-06-17 MED ORDER — MIDAZOLAM HCL 5 MG/5ML IJ SOLN
INTRAMUSCULAR | Status: DC | PRN
  Administered 2015-06-17: 2 mg via INTRAVENOUS

## 2015-06-17 MED ORDER — ACETAMINOPHEN 325 MG PO TABS
650.0000 mg | ORAL_TABLET | Freq: Four times a day (QID) | ORAL | Status: DC | PRN
  Administered 2015-06-18: 650 mg via ORAL
  Filled 2015-06-17: qty 2

## 2015-06-17 MED ORDER — EPHEDRINE SULFATE 50 MG/ML IJ SOLN
INTRAMUSCULAR | Status: DC | PRN
  Administered 2015-06-17: 10 mg via INTRAVENOUS

## 2015-06-17 MED ORDER — MORPHINE SULFATE (PF) 4 MG/ML IV SOLN
4.0000 mg | Freq: Once | INTRAVENOUS | Status: AC
  Administered 2015-06-17: 4 mg via INTRAVENOUS
  Filled 2015-06-17: qty 1

## 2015-06-17 MED ORDER — NEOSTIGMINE METHYLSULFATE 10 MG/10ML IV SOLN
INTRAVENOUS | Status: AC
  Filled 2015-06-17: qty 1

## 2015-06-17 SURGICAL SUPPLY — 38 items
APPLIER CLIP 5 13 M/L LIGAMAX5 (MISCELLANEOUS) ×3
APR CLP MED LRG 5 ANG JAW (MISCELLANEOUS) ×1
BAG SPEC RTRVL 10 TROC 200 (ENDOMECHANICALS) ×1
BLADE SURG ROTATE 9660 (MISCELLANEOUS) IMPLANT
CANISTER SUCTION 2500CC (MISCELLANEOUS) ×3 IMPLANT
CHLORAPREP W/TINT 26ML (MISCELLANEOUS) ×3 IMPLANT
CLIP APPLIE 5 13 M/L LIGAMAX5 (MISCELLANEOUS) ×1 IMPLANT
CLOSURE WOUND 1/2 X4 (GAUZE/BANDAGES/DRESSINGS) ×1
COVER SURGICAL LIGHT HANDLE (MISCELLANEOUS) ×3 IMPLANT
DEVICE TROCAR PUNCTURE CLOSURE (ENDOMECHANICALS) ×3 IMPLANT
ELECT REM PT RETURN 9FT ADLT (ELECTROSURGICAL) ×3
ELECTRODE REM PT RTRN 9FT ADLT (ELECTROSURGICAL) ×1 IMPLANT
GLOVE BIO SURGEON STRL SZ7 (GLOVE) ×9 IMPLANT
GLOVE BIO SURGEON STRL SZ7.5 (GLOVE) ×2 IMPLANT
GLOVE BIOGEL PI IND STRL 7.5 (GLOVE) ×1 IMPLANT
GLOVE BIOGEL PI INDICATOR 7.5 (GLOVE) ×4
GOWN STRL REUS W/ TWL LRG LVL3 (GOWN DISPOSABLE) ×3 IMPLANT
GOWN STRL REUS W/TWL LRG LVL3 (GOWN DISPOSABLE) ×9
KIT BASIN OR (CUSTOM PROCEDURE TRAY) ×3 IMPLANT
KIT ROOM TURNOVER OR (KITS) ×3 IMPLANT
LIQUID BAND (GAUZE/BANDAGES/DRESSINGS) ×3 IMPLANT
NS IRRIG 1000ML POUR BTL (IV SOLUTION) ×3 IMPLANT
PAD ARMBOARD 7.5X6 YLW CONV (MISCELLANEOUS) ×3 IMPLANT
POUCH RETRIEVAL ECOSAC 10 (ENDOMECHANICALS) ×1 IMPLANT
POUCH RETRIEVAL ECOSAC 10MM (ENDOMECHANICALS) ×2
SCISSORS LAP 5X35 DISP (ENDOMECHANICALS) ×3 IMPLANT
SET IRRIG TUBING LAPAROSCOPIC (IRRIGATION / IRRIGATOR) ×3 IMPLANT
SLEEVE ENDOPATH XCEL 5M (ENDOMECHANICALS) ×6 IMPLANT
SPECIMEN JAR SMALL (MISCELLANEOUS) ×3 IMPLANT
STRIP CLOSURE SKIN 1/2X4 (GAUZE/BANDAGES/DRESSINGS) ×2 IMPLANT
SUT MNCRL AB 4-0 PS2 18 (SUTURE) ×3 IMPLANT
SUT VICRYL 0 UR6 27IN ABS (SUTURE) ×2 IMPLANT
TOWEL OR 17X24 6PK STRL BLUE (TOWEL DISPOSABLE) ×3 IMPLANT
TOWEL OR 17X26 10 PK STRL BLUE (TOWEL DISPOSABLE) ×3 IMPLANT
TRAY LAPAROSCOPIC MC (CUSTOM PROCEDURE TRAY) ×3 IMPLANT
TROCAR XCEL BLUNT TIP 100MML (ENDOMECHANICALS) ×3 IMPLANT
TROCAR XCEL NON-BLD 5MMX100MML (ENDOMECHANICALS) ×3 IMPLANT
TUBING INSUFFLATION (TUBING) ×3 IMPLANT

## 2015-06-17 NOTE — ED Provider Notes (Signed)
TIME SEEN: 4:40 AM  CHIEF COMPLAINT: Right upper quadrant abdominal pain, nausea  HPI: Pt is a 34 y.o. female with previous history of drug abuse who presents to the emergency department with right upper quadrant pain that started last night with nausea. No vomiting or diarrhea. No dysuria or hematuria. Has never had similar symptoms. Patient reports she ate pizza last night. No prior history of issues with her gallbladder, pancreatitis. Denies heavy alcohol use. Denies prior abdominal surgery.  ROS: See HPI Constitutional: no fever  Eyes: no drainage  ENT: no runny nose   Cardiovascular:  no chest pain  Resp: no SOB  GI: no vomiting GU: no dysuria Integumentary: no rash  Allergy: no hives  Musculoskeletal: no leg swelling  Neurological: no slurred speech ROS otherwise negative  PAST MEDICAL HISTORY/PAST SURGICAL HISTORY:  History reviewed. No pertinent past medical history.  MEDICATIONS:  Prior to Admission medications   Medication Sig Start Date End Date Taking? Authorizing Provider  albuterol (PROVENTIL HFA;VENTOLIN HFA) 108 (90 BASE) MCG/ACT inhaler Inhale 2 puffs into the lungs every 6 (six) hours as needed for wheezing. For shortness of breath 10/15/12   Johnnette Gourd, MD  guaiFENesin-dextromethorphan [facility] DM) 100-10 MG/5ML syrup Take 5 mLs by mouth 3 (three) times daily as needed for cough. 10/06/12   Marlon Pel, PA-C  HYDROcodone-acetaminophen (NORCO/VICODIN) 5-325 MG per tablet Take 2 tablets by mouth every 4 (four) hours as needed. 04/01/15   Elson Areas, PA-C  ibuprofen (ADVIL,MOTRIN) 800 MG tablet Take 1 tablet (800 mg total) by mouth 3 (three) times daily. 04/01/15   Elson Areas, PA-C    ALLERGIES:  Allergies  Allergen Reactions  . Penicillins     Unsure     SOCIAL HISTORY:  Social History  Substance Use Topics  . Smoking status: Current Every Day Smoker -- 2.00 packs/day    Types: Cigarettes  . Smokeless tobacco: Not on file  . Alcohol Use: Yes     FAMILY HISTORY: No family history on file.  EXAM: BP 121/70 mmHg  Pulse 109  Temp(Src) 97.8 F (36.6 C) (Oral)  Resp 18  Ht  (1.6 m)  Wt 106 lb 3 oz (48.166 kg)  BMI 18.81 kg/m2  SpO2 100%  LMP 06/16/2015 (Exact Date) CONSTITUTIONAL: Alert and oriented and responds appropriately to questions. Afebrile but appears uncomfortable HEAD: Normocephalic EYES: Conjunctivae clear, PERRL ENT: normal nose; no rhinorrhea; moist mucous membranes; pharynx without lesions noted NECK: Supple, no meningismus, no LAD  CARD: Regular and tachycardic; S1 and S2 appreciated; no murmurs, no clicks, no rubs, no gallops RESP: Normal chest excursion without splinting or tachypnea; breath sounds clear and equal bilaterally; no wheezes, no rhonchi, no rales, no hypoxia or respiratory distress, speaking full sentences ABD/GI: Normal bowel sounds; non-distended; soft, tender to palpation in the right upper quadrant with positive Murphy sign, no rebound, no guarding, no peritoneal signs, no tenderness at McBurney's point BACK:  The back appears normal and is non-tender to palpation, there is no CVA tenderness EXT: Normal ROM in all joints; non-tender to palpation; no edema; normal capillary refill; no cyanosis, no calf tenderness or swelling    SKIN: Normal color for age and race; warm NEURO: Moves all extremities equally, sensation to light touch intact diffusely, cranial nerves II through XII intact PSYCH: The patient's mood and manner are appropriate. Grooming and personal hygiene are appropriate.  MEDICAL DECISION MAKING: Patient here with right upper quadrant tenderness with positive Murphy sign. Differential diagnosis includes cholecystitis,  cholelithiasis, choledocholithiasis, pancreatitis, less likely kidney stone, colitis. We'll obtain labs, urine and a right upper quadrant ultrasound. We'll keep nothing by mouth and give IV fluids, pain and nausea medicine.  ED PROGRESS: Patient has a  leukocytosis of 12.3. Ultrasound shows cholelithiasis with positive Murphy sign consistent with acute cholecystitis. No gallbladder wall thickening or bile duct dilatation. Discussed with Dr. Janee Morn with general surgery who will see the patient in the ED and admit. Patient updated with plan.    EKG Interpretation  Date/Time:  Tuesday June 17 2015 09:22:54 EDT Ventricular Rate:  58 PR Interval:  144 QRS Duration: 79 QT Interval:  440 QTC Calculation: 432 R Axis:   75 Text Interpretation:  Sinus rhythm Confirmed by Taraneh Metheney,  DO, Rehana Uncapher ([id number]) on 06/18/2015 6:38:22 AM        Layla Maw Aalyiah Camberos, DO 06/18/15 [id number]

## 2015-06-17 NOTE — Discharge Instructions (Signed)

## 2015-06-17 NOTE — Transfer of Care (Signed)
Immediate Anesthesia Transfer of Care Note  Patient: Olivia Mcdaniel  Procedure(s) Performed: Procedure(s): LAPAROSCOPIC CHOLECYSTECTOMY (N/A)  Patient Location: PACU  Anesthesia Type:General  Level of Consciousness: awake, alert , oriented and patient cooperative  Airway & Oxygen Therapy: Patient Spontanous Breathing and Patient connected to nasal cannula oxygen  Post-op Assessment: Report given to RN, Post -op Vital signs reviewed and stable and Patient moving all extremities  Post vital signs: Reviewed and stable  Last Vitals:  Filed Vitals:   06/17/15 1318  BP: 118/83  Pulse: 102  Temp: 36.1 C  Resp: 17    Complications: No apparent anesthesia complications

## 2015-06-17 NOTE — ED Notes (Signed)
MD notified of patient's continued pain, to place orders.

## 2015-06-17 NOTE — Anesthesia Postprocedure Evaluation (Signed)
  Anesthesia Post-op Note  Patient: Olivia Mcdaniel  Procedure(s) Performed: Procedure(s): LAPAROSCOPIC CHOLECYSTECTOMY (N/A)  Patient Location: PACU  Anesthesia Type:General  Level of Consciousness: awake and alert   Airway and Oxygen Therapy: Patient Spontanous Breathing  Post-op Pain: Controlled  Post-op Assessment: Post-op Vital signs reviewed, Patient's Cardiovascular Status Stable and Respiratory Function Stable  Post-op Vital Signs: Reviewed  Filed Vitals:   06/17/15 1345  BP:   Pulse: 85  Temp: 36.2 C  Resp: 9    Complications: No apparent anesthesia complications

## 2015-06-17 NOTE — ED Notes (Signed)
Spoke to Dr. Dwain Sarna in regards to patient's BP, he reports this is an appropriate BP for this patient due to size. Pt a&o X4. 500cc bolus verbally ordered.

## 2015-06-17 NOTE — Progress Notes (Signed)
Quietm,lying r side on way to floor in bed. Asked location of SO and told he is being sent to her room by volunteers. Seemed content with message.

## 2015-06-17 NOTE — ED Notes (Signed)
Surgery at bedside.

## 2015-06-17 NOTE — ED Notes (Signed)
Attempted report 

## 2015-06-17 NOTE — Anesthesia Procedure Notes (Signed)
Procedure Name: Intubation Date/Time: 06/17/2015 12:21 PM Performed by: Orlinda Blalock, Wallice Granville L Pre-anesthesia Checklist: Patient identified, Emergency Drugs available, Suction available, Patient being monitored and Timeout performed Patient Re-evaluated:Patient Re-evaluated prior to inductionOxygen Delivery Method: Circle system utilized Preoxygenation: Pre-oxygenation with 100% oxygen Intubation Type: IV induction and Cricoid Pressure applied Laryngoscope Size: Mac and 3 Grade View: Grade I Tube type: Oral Tube size: 7.0 mm Number of attempts: 1 Airway Equipment and Method: Stylet Placement Confirmation: ETT inserted through vocal cords under direct vision,  breath sounds checked- equal and bilateral and positive ETCO2 Secured at: 18 cm Tube secured with: Tape Dental Injury: Teeth and Oropharynx as per pre-operative assessment  Comments: Poor dentition in preop condition

## 2015-06-17 NOTE — ED Notes (Signed)
Pt reports cocaine use yesterday 

## 2015-06-17 NOTE — ED Notes (Signed)
Pt arrives with c/o R abdominal pain radiating to flank. Pt reports she woke up with it this morning. Pain feels tight, 5/10. States diarrhea starting this morning. Denies N/V. Denies urinary symptoms.

## 2015-06-17 NOTE — ED Notes (Signed)
Spoke with patient's mother on her cell phone about patient's plan of care. Pt verbalized approval for RN to speak with mother.

## 2015-06-17 NOTE — Anesthesia Preprocedure Evaluation (Addendum)
Anesthesia Evaluation  Patient identified by MRN, date of birth, ID band Patient awake    Reviewed: Allergy & Precautions, H&P , NPO status , Patient's Chart, lab work & pertinent test results  Airway Mallampati: I  TM Distance: >3 FB Neck ROM: Full    Dental no notable dental hx. (+) Poor Dentition, Dental Advisory Given   Pulmonary Current Smoker,    Pulmonary exam normal breath sounds clear to auscultation       Cardiovascular negative cardio ROS   Rhythm:Regular Rate:Normal     Neuro/Psych negative neurological ROS  negative psych ROS   GI/Hepatic negative GI ROS, Neg liver ROS, (+)     substance abuse  cocaine use,   Endo/Other  negative endocrine ROS  Renal/GU negative Renal ROS  negative genitourinary   Musculoskeletal   Abdominal   Peds  Hematology negative hematology ROS (+)   Anesthesia Other Findings   Reproductive/Obstetrics negative OB ROS                            Anesthesia Physical Anesthesia Plan  ASA: II  Anesthesia Plan: General   Post-op Pain Management:    Induction: Intravenous  Airway Management Planned: Oral ETT  Additional Equipment:   Intra-op Plan:   Post-operative Plan: Extubation in OR  Informed Consent: I have reviewed the patients History and Physical, chart, labs and discussed the procedure including the risks, benefits and alternatives for the proposed anesthesia with the patient or authorized representative who has indicated his/her understanding and acceptance.   Dental advisory given  Plan Discussed with: CRNA  Anesthesia Plan Comments:         Anesthesia Quick Evaluation

## 2015-06-17 NOTE — Op Note (Signed)
Preoperative diagnosis: cholecystitis Postoperative diagnosis:  cholecystitis Procedure: laparoscopic cholecystectomy  Surgeon: Dr Harden Mo Anesthesia: general EBL: minimal Drains none Specimen gb and contents to pathology Complications: none Sponge count correct at completion Disposition to recovery stable  Indications: This is a 37 yof who presents with calculous cholecystitis We discussed proceeding with lap chole.   Procedure: After informed consent was obtained the patient was taken to the operating room. She was already given antibiotics. Sequential compression devices were on her legs. She was placed under general anesthesia without complication. Her abdomen was prepped and draped in the standard sterile surgical fashion. A surgical timeout was then performed.  I infiltrated marcaine below the umbilicus. I made an incision and then entered the fascia sharply. I then entered the peritoneum bluntly. There was no evidence of an entry injury. I placed a 0 vicryl pursestring suture and inserted a hasson trocar. I then inserted 3 further 5 mm trocars in the epigastrium and ruq.. I dissected the duodenum away from the gallbladder due to scarring. I was able to retract the gallbladder cephalad and lateral.  Eventually I was able to identify the cystic duct and clearly had the critical view of safety. I was able to visualize the CBD well also. I then clipped the cystic duct and divided it. The duct was viable and the clips traversed the duct.  I then clipped the cystic artery and divided it. I then removed the gallbladder from the liver bed and placed it in a bag. It was then removed from the umbilical incision. I then obtained hemostasis and irrigated.. I then removed the umbilical trocar and closed with 0 vicryl and the endoclose device after tying down the pursestring.  I then desufflated the abdomen and removed all my remaining trocars. I then closed these with 4-0  Monocryl and Dermabond. She tolerated this well was extubated and transferred to the recovery room in stable condition

## 2015-06-17 NOTE — H&P (Signed)
Olivia Mcdaniel is an 34 y.o. female.   Chief Complaint: ruq pain HPI: 22 yof with history of cocaine use (last yesterday) who developed first episode of ruq pain last night. This has persisted and not gotten any better. Hurts around right flank. No n/v having normal bms. No fevers.  She presented and underwent US that shows gallstones and cholecystitis.    History reviewed. No pertinent past medical history.  History reviewed. No pertinent past surgical history.  No family history on file. Social History:  reports that she has been smoking Cigarettes.  She has been smoking about 2.00 packs per day. She does not have any smokeless tobacco history on file. She reports that she drinks alcohol. She reports that she uses illicit drugs (Cocaine).  Allergies:  Allergies  Allergen Reactions  . Penicillins     Unsure     meds none  Results for orders placed or performed during the hospital encounter of 06/17/15 (from the past 48 hour(s))  Lipase, blood     Status: Abnormal   Collection Time: 06/17/15  4:30 AM  Result Value Ref Range   Lipase 17 (L) 22 - 51 U/L  Comprehensive metabolic panel     Status: Abnormal   Collection Time: 06/17/15  4:30 AM  Result Value Ref Range   Sodium 134 (L) 135 - 145 mmol/L   Potassium 3.6 3.5 - 5.1 mmol/L   Chloride 101 101 - 111 mmol/L   CO2 25 22 - 32 mmol/L   Glucose, Bld 111 (H) 65 - 99 mg/dL   BUN <5 (L) 6 - 20 mg/dL   Creatinine, Ser 0.74 0.44 - 1.00 mg/dL   Calcium 8.4 (L) 8.9 - 10.3 mg/dL   Total Protein 6.6 6.5 - 8.1 g/dL   Albumin 3.5 3.5 - 5.0 g/dL   AST 23 15 - 41 U/L   ALT 7 (L) 14 - 54 U/L   Alkaline Phosphatase 63 38 - 126 U/L   Total Bilirubin 1.0 0.3 - 1.2 mg/dL   GFR calc non Af Amer >60 >60 mL/min   GFR calc Af Amer >60 >60 mL/min    Comment: (NOTE) The eGFR has been calculated using the CKD EPI equation. This calculation has not been validated in all clinical situations. eGFR's persistently <60 mL/min signify possible  Chronic Kidney Disease.    Anion gap 8 5 - 15  CBC     Status: Abnormal   Collection Time: 06/17/15  4:30 AM  Result Value Ref Range   WBC 12.3 (H) 4.0 - 10.5 K/uL   RBC 4.21 3.87 - 5.11 MIL/uL   Hemoglobin 13.9 12.0 - 15.0 g/dL   HCT 40.9 36.0 - 46.0 %   MCV 97.1 78.0 - 100.0 fL   MCH 33.0 26.0 - 34.0 pg   MCHC 34.0 30.0 - 36.0 g/dL   RDW 13.1 11.5 - 15.5 %   Platelets 343 150 - 400 K/uL  Urinalysis, Routine w reflex microscopic (not at [facility])     Status: Abnormal   Collection Time: 06/17/15  6:12 AM  Result Value Ref Range   Color, Urine YELLOW YELLOW   APPearance CLEAR CLEAR   Specific Gravity, Urine 1.003 (L) 1.005 - 1.030   pH 6.5 5.0 - 8.0   Glucose, UA NEGATIVE NEGATIVE mg/dL   Hgb urine dipstick NEGATIVE NEGATIVE   Bilirubin Urine NEGATIVE NEGATIVE   Ketones, ur NEGATIVE NEGATIVE mg/dL   Protein, ur NEGATIVE NEGATIVE mg/dL   Urobilinogen, UA 0.2 0.0 -  1.0 mg/dL   Nitrite NEGATIVE NEGATIVE   Leukocytes, UA NEGATIVE NEGATIVE    Comment: MICROSCOPIC NOT DONE ON URINES WITH NEGATIVE PROTEIN, BLOOD, LEUKOCYTES, NITRITE, OR GLUCOSE <1000 mg/dL.  POC urine preg, ED (not at [facility])     Status: None   Collection Time: 06/17/15  6:17 AM  Result Value Ref Range   Preg Test, Ur NEGATIVE NEGATIVE    Comment:        THE SENSITIVITY OF THIS METHODOLOGY IS >24 mIU/mL    US Abdomen Complete  06/17/2015   CLINICAL DATA:  Right upper quadrant pain. Pain radiates to the right flank. Pain woke the patient up this morning.  EXAM: ULTRASOUND ABDOMEN COMPLETE  COMPARISON:  None.  FINDINGS: Gallbladder: Cholelithiasis with multiple mobile stones in the gallbladder. Largest measures about 1.2 cm diameter. No significant gallbladder wall thickening or edema. Murphy's sign is positive.  Common bile duct: Diameter: 3 mm, normal  Liver: No focal lesion identified. Within normal limits in parenchymal echogenicity.  IVC: No abnormality visualized.  Pancreas: Visualized portion unremarkable.  Spleen:  Size and appearance within normal limits.  Right Kidney: Length: 10.6 cm. Echogenicity within normal limits. No mass or hydronephrosis visualized.  Left Kidney: Length: 13 cm. Echogenicity within normal limits. No mass or hydronephrosis visualized.  Abdominal aorta: No aneurysm visualized.  Other findings: None.  IMPRESSION: Cholelithiasis with positive Murphy's sign. Changes are consistent with acute cholecystitis in the appropriate clinical setting. No gallbladder wall thickening or bile duct dilatation.   Electronically Signed   By: Lucienne Capers M.D.   On: 06/17/2015 05:39    Review of Systems  Constitutional: Negative for fever and chills.  Respiratory: Negative for cough and shortness of breath.   Cardiovascular: Negative for chest pain.  Gastrointestinal: Positive for abdominal pain. Negative for nausea, vomiting, diarrhea and constipation.  Musculoskeletal: Negative for back pain.  Neurological: Negative for headaches.    Blood pressure 93/62, pulse 87, temperature 97.8 F (36.6 C), temperature source Oral, resp. rate 16, height 5' 3" (1.6 m), weight 48.166 kg (106 lb 3 oz), last menstrual period 06/16/2015, SpO2 100 %. Physical Exam  Vitals reviewed. Constitutional: No distress.  HENT:  Mouth/Throat: Abnormal dentition.  Eyes: No scleral icterus.  Cardiovascular: Normal rate, regular rhythm and normal heart sounds.   Respiratory: Effort normal and breath sounds normal. She has no wheezes. She has no rales.  GI: Soft. Bowel sounds are normal. She exhibits no distension. There is tenderness in the right upper quadrant. There is positive Murphy's sign.  Lymphadenopathy:    She has no cervical adenopathy.  Skin: She is not diaphoretic.     Assessment/Plan Cholecystitis  Admission, iv abx, cholecystectomy I discussed the procedure in detail.    We discussed the risks and benefits of a laparoscopic cholecystectomy and possible cholangiogram including, but not limited to  bleeding, infection, injury to surrounding structures such as the intestine or liver, bile leak, retained gallstones, need to convert to an open procedure, prolonged diarrhea, blood clots such as  DVT, common bile duct injury, anesthesia risks, and possible need for additional procedures.  The likelihood of improvement in symptoms and return to the patient's normal status is good. We discussed the typical post-operative recovery course.   Rakia Frayne 06/17/2015, 7:25 AM

## 2015-06-18 ENCOUNTER — Encounter (HOSPITAL_COMMUNITY): Payer: Self-pay | Admitting: General Surgery

## 2015-06-18 MED ORDER — HYDROCODONE-ACETAMINOPHEN 5-325 MG PO TABS: 1.0000 | ORAL_TABLET | ORAL | Status: DC | PRN

## 2015-06-18 MED ORDER — PROMETHAZINE HCL 25 MG/ML IJ SOLN
6.2500 mg | Freq: Four times a day (QID) | INTRAMUSCULAR | Status: DC | PRN
  Administered 2015-06-18: 6.25 mg via INTRAVENOUS
  Filled 2015-06-18: qty 1

## 2015-06-18 NOTE — Discharge Summary (Signed)
Physician Discharge Summary  Patient ID: Marinda ElkChristina M Mcdaniel MRN: [id number] DOB/AGE: [birth year] 34 y.o.  Admit date: 06/17/2015 Discharge date: 06/18/2015  Admission Diagnoses: Cholecystitis  Discharge Diagnoses:  Active Problems:   Cholecystitis   Discharged Condition: good  Hospital Course: [age] yof admitted and underwent uneventful lap chole for cholecystitis. She is doing well following day and will be discharged  Consults: None  Significant Diagnostic Studies: none  Treatments: surgery: lap chole  Discharge Exam: Blood pressure 103/51, pulse 62, temperature 98.3 F (36.8 C), temperature source Tympanic, resp. rate 17, height 5\' 3"  (1.6 m), weight 47.628 kg (105 lb), last menstrual period 06/16/2015, SpO2 99 %. GI: approp tender incisions clean  Disposition: 01-Home or Self Care     Medication List    TAKE these medications        albuterol 108 (90 BASE) MCG/ACT inhaler  Commonly known as:  PROVENTIL HFA;VENTOLIN HFA  Inhale 2 puffs into the lungs every 6 (six) hours as needed for wheezing. For shortness of breath     guaiFENesin-dextromethorphan 100-10 MG/5ML syrup  Commonly known as:  ROBITUSSIN DM  Take 5 mLs by mouth 3 (three) times daily as needed for cough.     HYDROcodone-acetaminophen 5-325 MG tablet  Commonly known as:  NORCO/VICODIN  Take 2 tablets by mouth every 4 (four) hours as needed.     HYDROcodone-acetaminophen 5-325 MG tablet  Commonly known as:  NORCO/VICODIN  Take 1-2 tablets by mouth every 4 (four) hours as needed for moderate pain.     ibuprofen 800 MG tablet  Commonly known as:  ADVIL,MOTRIN  Take 1 tablet (800 mg total) by mouth 3 (three) times daily.           Follow-up Information    Follow up with [facility] On 07/09/2015.   Specialty:  General Surgery   Why:  arrive by 8:15AM for a 8:45AM post operative check up   Contact information:   [address] [place] [address] [phone]        Signed: Emelia LoronWAKEFIELD,Olivia Mcdaniel 06/18/2015, 8:19 AM

## 2015-07-28 ENCOUNTER — Emergency Department (HOSPITAL_COMMUNITY)
Admission: EM | Admit: 2015-07-28 | Discharge: 2015-07-28 | Disposition: A | Discharge status: Home / Self Care | Payer: Self-pay | Attending: Emergency Medicine | Admitting: Emergency Medicine

## 2015-07-28 ENCOUNTER — Emergency Department (HOSPITAL_COMMUNITY): Payer: Self-pay

## 2015-07-28 DIAGNOSIS — Z8719 Personal history of other diseases of the digestive system: Secondary | ICD-10-CM | POA: Insufficient documentation

## 2015-07-28 DIAGNOSIS — Y998 Other external cause status: Secondary | ICD-10-CM | POA: Insufficient documentation

## 2015-07-28 DIAGNOSIS — Z880 Allergy status to penicillin: Secondary | ICD-10-CM | POA: Insufficient documentation

## 2015-07-28 DIAGNOSIS — Y9289 Other specified places as the place of occurrence of the external cause: Secondary | ICD-10-CM | POA: Insufficient documentation

## 2015-07-28 DIAGNOSIS — Y9389 Activity, other specified: Secondary | ICD-10-CM | POA: Insufficient documentation

## 2015-07-28 DIAGNOSIS — M25532 Pain in left wrist: Secondary | ICD-10-CM

## 2015-07-28 DIAGNOSIS — X58XXXA Exposure to other specified factors, initial encounter: Secondary | ICD-10-CM | POA: Insufficient documentation

## 2015-07-28 DIAGNOSIS — S6992XA Unspecified injury of left wrist, hand and finger(s), initial encounter: Secondary | ICD-10-CM | POA: Insufficient documentation

## 2015-07-28 DIAGNOSIS — F17210 Nicotine dependence, cigarettes, uncomplicated: Secondary | ICD-10-CM | POA: Insufficient documentation

## 2015-07-28 MED ORDER — ACETAMINOPHEN 325 MG PO TABS
650.0000 mg | ORAL_TABLET | Freq: Once | ORAL | Status: AC
  Administered 2015-07-28: 650 mg via ORAL
  Filled 2015-07-28: qty 2

## 2015-07-28 MED ORDER — NAPROXEN 250 MG PO TABS: 250.0000 mg | ORAL_TABLET | Freq: Two times a day (BID) | ORAL | Status: DC

## 2015-07-28 NOTE — Discharge Instructions (Signed)
Wrist Pain There are many things that can cause wrist pain. Some common causes include:  An injury to the wrist area, such as a sprain, strain, or fracture.  Overuse of the joint.  A condition that causes increased pressure on a nerve in the wrist (carpal tunnel syndrome).  Wear and tear of the joints that occurs with aging (osteoarthritis).  A variety of other types of arthritis. Sometimes, the cause of wrist pain is not known. The pain often goes away when you follow your health care provider's instructions for relieving pain at home. If your wrist pain continues, tests may need to be done to diagnose your condition. HOME CARE INSTRUCTIONS Pay attention to any changes in your symptoms. Take these actions to help with your pain:  Rest the wrist area for at least 48 hours or as told by your health care provider.  If directed, apply ice to the injured area:  Put ice in a plastic bag.  Place a towel between your skin and the bag.  Leave the ice on for 20 minutes, 2-3 times per day.  Keep your arm raised (elevated) above the level of your heart while you are sitting or lying down.  If a splint or elastic bandage has been applied, use it as told by your health care provider.  Remove the splint or bandage only as told by your health care provider.  Loosen the splint or bandage if your fingers become numb or have a tingling feeling, or if they turn cold or blue.  Take over-the-counter and prescription medicines only as told by your health care provider.  Keep all follow-up visits as told by your health care provider. This is important. SEEK MEDICAL CARE IF:  Your pain is not helped by treatment.  Your pain gets worse. SEEK IMMEDIATE MEDICAL CARE IF:  Your fingers become swollen.  Your fingers turn white, very red, or cold and blue.  Your fingers are numb or have a tingling feeling.  You have difficulty moving your fingers.   This information is not intended to replace  advice given to you by your health care provider. Make sure you discuss any questions you have with your health care provider.   Document Released: 06/02/2005 Document Revised: 05/14/2015 Document Reviewed: 01/08/2015 Elsevier Interactive Patient Education 2016 [employer]  [employer] Therapy Heat therapy can help ease sore, stiff, injured, and tight muscles and joints. Heat relaxes your muscles, which may help ease your pain.  RISKS AND COMPLICATIONS If you have any of the following conditions, do not use heat therapy unless your health care provider has approved:  Poor circulation.  Healing wounds or scarred skin in the area being treated.  Diabetes, heart disease, or high blood pressure.  Not being able to feel (numbness) the area being treated.  Unusual swelling of the area being treated.  Active infections.  Blood clots.  Cancer.  Inability to communicate pain. This may include young children and people who have problems with their brain function (dementia).  Pregnancy. Heat therapy should only be used on old, pre-existing, or long-lasting (chronic) injuries. Do not use heat therapy on new injuries unless directed by your health care provider. HOW TO USE HEAT THERAPY There are several different kinds of heat therapy, including:  Moist heat pack.  Warm water bath.  Hot water bottle.  Electric heating pad.  Heated gel pack.  Heated wrap.  Electric heating pad. Use the heat therapy method suggested by your health care provider. Follow your health  care provider's instructions on when and how to use heat therapy. GENERAL HEAT THERAPY RECOMMENDATIONS  Do not sleep while using heat therapy. Only use heat therapy while you are awake.  Your skin may turn pink while using heat therapy. Do not use heat therapy if your skin turns red.  Do not use heat therapy if you have new pain.  High heat or long exposure to heat can cause burns. Be careful when using heat therapy to  avoid burning your skin.  Do not use heat therapy on areas of your skin that are already irritated, such as with a rash or sunburn. SEEK MEDICAL CARE IF:  You have blisters, redness, swelling, or numbness.  You have new pain.  Your pain is worse. MAKE SURE YOU:  Understand these instructions.  Will watch your condition.  Will get help right away if you are not doing well or get worse.   This information is not intended to replace advice given to you by your health care provider. Make sure you discuss any questions you have with your health care provider.   Document Released: 11/15/2011 Document Revised: 09/13/2014 Document Reviewed: 10/16/2013 Elsevier Interactive Patient Education 2016 [employer] Wrist Splint A splint is a medical device that keeps an injured part of your body from moving. A splint supports your wrist like a cast, but it is more flexible. It can be removed or loosened. The supporting part of a splint does not completely surround your wrist. It is held in place with an elastic band or straps. You may need a wrist splint if you have hurt your wrist or if you have a condition that causes swelling. Depending on the type of wrist problem you have, your splint may extend up your arm, onto your hand, or around your thumb. The wrist splint may be worn to:  Support your wrist.  Protect your injury.  Prevent further injury.  Prevent movement.  Reduce pain.  Promote healing. RISKS AND COMPLICATIONS The most dangerous complication of wearing a splint is having a reduced blood supply to your wrist or hand. This can happen if there is a lot of swelling or if the splint is too tight. This results in a condition called compartment syndrome and can cause permanent damage. Symptoms include:  Pain that is getting worse.  Tingling and numbness.  Changes in skin color (paleness or a bluish color).  Cold fingers. Other complications of wearing a splint can include:    Skin irritation that can cause:  Itching.  Rash.  Skin sores.  Skin infection.  Wrist stiffness. This can occur if you have worn a splint for a long time. HOW TO USE YOUR WRIST SPLINT Your wrist splint should be tight enough to support your wrist without cutting off your blood supply. How long you need to wear the splint depends on the type of wrist problem you have. Your health care provider will instruct you on how to wear your wrist splint and for how long.  Follow all your health care provider's instructions.  Take medicine only as directed by your health care provider.  Keep your wrist elevated above the level of your heart while resting.  Ice may help reduce pain and swelling.  Place ice in a plastic bag.  Place a towel between your splint and the bag.  Leave the ice on for 20 minutes, 2-3 times a day.  Do not get your splint wet.  Do not push objects under your splint to scratch  your skin.  Loosen your splint if it feels too tight. Consult your health care provider if you have questions about how tight to wear the splint.  Keep all follow-up visits as directed by your health care provider. This is important. SEEK MEDICAL CARE IF:  You have wrist pain or swelling that does not go away.  The skin around or under your splint becomes red, itchy, or moist.  You have chills or fever.  Your splint feels too tight or too loose.  Your splint gets damaged. SEEK IMMEDIATE MEDICAL CARE IF: You have symptoms of compartment syndrome. These include:   Pain that is getting worse.  Tingling and numbness.  Changes in skin color (paleness or a bluish color).  Cold fingers. MAKE SURE YOU:  Understand these instructions.  Will watch your condition.  Will get help right away if you are not doing well or get worse.   This information is not intended to replace advice given to you by your health care provider. Make sure you discuss any questions you have with your  health care provider.   Document Released: 08/05/2006 Document Revised: 09/13/2014 Document Reviewed: 12/04/2013 Elsevier Interactive Patient Education [employer]

## 2015-07-28 NOTE — ED Notes (Signed)
Pt c/o left wrist pain x 2 days ago. Denies injury.

## 2015-07-28 NOTE — ED Provider Notes (Signed)
CSN: [id number]     Arrival date & time 07/28/15  1611 History  By signing my name below, I, Murriel Hopper, attest that this documentation has been prepared under the direction and in the presence of Will Islam Villescas, PA-C.  Electronically Signed: Murriel Hopper, ED Scribe. 07/28/2015. 5:41 PM.    Chief Complaint  Patient presents with  . Wrist Pain     Patient is a 34 y.o. female presenting with wrist pain. The history is provided by the patient. No language interpreter was used.  Wrist Pain   HPI Comments: Olivia Mcdaniel is a 34 y.o. female who presents to the Emergency Department complaining of constant, worsening 9/10 left wrist pain that worsens with movement with associated tingling to her arm that has been present since yesterday. Pt reports she was lifting a dresser yesterday and heard a pop in her left wrist when her pain began. Pt states she has not taken anything for pain today. Pt denies any other symptoms.    Past Medical History  Diagnosis Date  . Complication of anesthesia   . PONV (postoperative nausea and vomiting)   . Gallstones    Past Surgical History  Procedure Laterality Date  . Cholecystectomy  06/17/2015  . Cholecystectomy N/A 06/17/2015    Procedure: LAPAROSCOPIC CHOLECYSTECTOMY;  Surgeon: Emelia Loron, MD;  Location: [facility] OR;  Service: General;  Laterality: N/A;   No family history on file. Social History  Substance Use Topics  . Smoking status: Current Every Day Smoker -- 2.00 packs/day    Types: Cigarettes  . Smokeless tobacco: Never Used  . Alcohol Use: Yes   OB History    No data available     Review of Systems  Constitutional: Negative for fever.  Musculoskeletal: Positive for arthralgias. Negative for joint swelling.  Skin: Negative for rash and wound.  Neurological: Negative for syncope, weakness and numbness.      Allergies  Penicillins  Home Medications   Prior to Admission medications   Medication Sig Start Date End Date  Taking? Authorizing Provider  albuterol (PROVENTIL HFA;VENTOLIN HFA) 108 (90 BASE) MCG/ACT inhaler Inhale 2 puffs into the lungs every 6 (six) hours as needed for wheezing. For shortness of breath Patient not taking: Reported on 06/17/2015 10/15/12   Johnnette Gourd, MD  guaiFENesin-dextromethorphan [facility] DM) 100-10 MG/5ML syrup Take 5 mLs by mouth 3 (three) times daily as needed for cough. Patient not taking: Reported on 06/17/2015 10/06/12   Marlon Pel, PA-C  HYDROcodone-acetaminophen (NORCO/VICODIN) 5-325 MG per tablet Take 2 tablets by mouth every 4 (four) hours as needed. Patient not taking: Reported on 06/17/2015 04/01/15   Elson Areas, PA-C  HYDROcodone-acetaminophen (NORCO/VICODIN) 5-325 MG tablet Take 1-2 tablets by mouth every 4 (four) hours as needed for moderate pain. 06/18/15   Emelia Loron, MD  ibuprofen (ADVIL,MOTRIN) 800 MG tablet Take 1 tablet (800 mg total) by mouth 3 (three) times daily. Patient not taking: Reported on 06/17/2015 04/01/15   Elson Areas, PA-C  naproxen (NAPROSYN) 250 MG tablet Take 1 tablet (250 mg total) by mouth 2 (two) times daily with a meal. 07/28/15   Everlene Farrier, PA-C   BP 97/62 mmHg  Pulse 95  Temp(Src) 97.6 F (36.4 C) (Oral)  Resp 20  Ht  (1.6 m)  Wt 47.826 kg  BMI 18.68 kg/m2  SpO2 100% Physical Exam  Constitutional: She appears well-developed and well-nourished. No distress.  Nontoxic appearing.  HENT:  Head: Normocephalic and atraumatic.  Eyes: Conjunctivae are normal.  Pupils are equal, round, and reactive to light. Right eye exhibits no discharge. Left eye exhibits no discharge.  Neck: Neck supple.  Cardiovascular: Normal rate, regular rhythm, normal heart sounds and intact distal pulses.   Bilateral radial pulses are intact. HR is 88.   Pulmonary/Chest: Effort normal and breath sounds normal. No respiratory distress. She has no wheezes. She has no rales.  Abdominal: Soft. There is no tenderness.  Musculoskeletal: She  exhibits tenderness.  Patient has tenderness to her left wrist diffusely, no focal tenderness. No snuff box tenderness. No left wrist edema, deformity, erythema. Good range of motion of her left wrist, elbow and shoulder. Bilateral radial pulses intact Good capillary refill bilaterally   Lymphadenopathy:    She has no cervical adenopathy.  Neurological: She is alert. Coordination normal.  The patient's sensation is intact to her bilateral upper extremities.   Skin: Skin is warm and dry. No rash noted. She is not diaphoretic. No erythema. No pallor.  Psychiatric: She has a normal mood and affect. Her behavior is normal.  Nursing note and vitals reviewed.   ED Course  Procedures (including critical care time)  DIAGNOSTIC STUDIES: Oxygen Saturation is 98% on room air, normal by my interpretation.    COORDINATION OF CARE: 5:39 PM Discussed treatment plan with pt at bedside and pt agreed to plan.   Labs Review Labs Reviewed - No data to display  Imaging Review Dg Wrist Complete Left  07/28/2015  CLINICAL DATA:  Left wrist pain for 2 days. No known injury. Initial encounter. EXAM: LEFT WRIST - COMPLETE 3+ VIEW COMPARISON:  None. FINDINGS: There is no evidence of fracture or dislocation. There is no evidence of arthropathy or other focal bone abnormality. Soft tissues are unremarkable. IMPRESSION: Negative exam. Electronically Signed   By: Drusilla Kanner M.D.   On: 07/28/2015 17:19   I have personally reviewed and evaluated these images as part of my medical decision-making.   EKG Interpretation None      Filed Vitals:   07/28/15 1624 07/28/15 1747  BP: 99/69 97/62  Pulse: 110 95  Temp: 97.6 F (36.4 C)   TempSrc: Oral   Resp: 16 20  Height:  (1.6 m)   Weight: 47.826 kg   SpO2: 98% 100%     MDM   Meds given in ED:  Medications  acetaminophen (TYLENOL) tablet 650 mg (650 mg Oral Given 07/28/15 1745)    Discharge Medication List as of 07/28/2015  5:42 PM     START taking these medications   Details  naproxen (NAPROSYN) 250 MG tablet Take 1 tablet (250 mg total) by mouth 2 (two) times daily with a meal., Starting 07/28/2015, Until Discontinued, Print        Final diagnoses:  Left wrist pain   This is a 34 year old female who presents to the emergency department complaining of left wrist pain after she was trying to move a dresser last night. On examination patient is afebrile nontoxic appearing. She has no left wrist edema, deformity, ecchymosis or erythema. Patient has good range of motion of her left wrist. Her left wrist is diffusely tender to palpation without focal tenderness. Left wrist x-rays unremarkable. Will provide the patient with Ace wrap and prescription for naproxen for pain control. I encouraged close follow-up by primary care. I advised the patient to follow-up with their primary care provider this week. I advised the patient to return to the emergency department with new or worsening symptoms or new concerns. The patient  verbalized understanding and agreement with plan.    I personally performed the services described in this documentation, which was scribed in my presence. The recorded information has been reviewed and is accurate.     Everlene FarrierWilliam Maniya Donovan, PA-C 07/28/15 1956  Arby BarretteMarcy Pfeiffer, MD 07/29/15 [phone]

## 2016-01-06 ENCOUNTER — Emergency Department (HOSPITAL_COMMUNITY): Payer: Self-pay

## 2016-01-06 ENCOUNTER — Emergency Department (HOSPITAL_COMMUNITY)
Admission: EM | Admit: 2016-01-06 | Discharge: 2016-01-06 | Disposition: A | Discharge status: Home / Self Care | Payer: Self-pay | Attending: Emergency Medicine | Admitting: Emergency Medicine

## 2016-01-06 ENCOUNTER — Encounter (HOSPITAL_COMMUNITY): Payer: Self-pay | Admitting: Emergency Medicine

## 2016-01-06 DIAGNOSIS — Y998 Other external cause status: Secondary | ICD-10-CM | POA: Insufficient documentation

## 2016-01-06 DIAGNOSIS — M79672 Pain in left foot: Secondary | ICD-10-CM

## 2016-01-06 DIAGNOSIS — S99922A Unspecified injury of left foot, initial encounter: Secondary | ICD-10-CM | POA: Insufficient documentation

## 2016-01-06 DIAGNOSIS — Z8719 Personal history of other diseases of the digestive system: Secondary | ICD-10-CM | POA: Insufficient documentation

## 2016-01-06 DIAGNOSIS — F17210 Nicotine dependence, cigarettes, uncomplicated: Secondary | ICD-10-CM | POA: Insufficient documentation

## 2016-01-06 DIAGNOSIS — Y9389 Activity, other specified: Secondary | ICD-10-CM | POA: Insufficient documentation

## 2016-01-06 DIAGNOSIS — Z791 Long term (current) use of non-steroidal anti-inflammatories (NSAID): Secondary | ICD-10-CM | POA: Insufficient documentation

## 2016-01-06 DIAGNOSIS — T23101A Burn of first degree of right hand, unspecified site, initial encounter: Secondary | ICD-10-CM | POA: Insufficient documentation

## 2016-01-06 DIAGNOSIS — Y9289 Other specified places as the place of occurrence of the external cause: Secondary | ICD-10-CM | POA: Insufficient documentation

## 2016-01-06 DIAGNOSIS — W228XXA Striking against or struck by other objects, initial encounter: Secondary | ICD-10-CM | POA: Insufficient documentation

## 2016-01-06 DIAGNOSIS — X19XXXA Contact with other heat and hot substances, initial encounter: Secondary | ICD-10-CM | POA: Insufficient documentation

## 2016-01-06 DIAGNOSIS — Z880 Allergy status to penicillin: Secondary | ICD-10-CM | POA: Insufficient documentation

## 2016-01-06 DIAGNOSIS — Z79899 Other long term (current) drug therapy: Secondary | ICD-10-CM | POA: Insufficient documentation

## 2016-01-06 MED ORDER — IBUPROFEN 400 MG PO TABS
600.0000 mg | ORAL_TABLET | Freq: Once | ORAL | Status: AC
  Administered 2016-01-06: 600 mg via ORAL
  Filled 2016-01-06: qty 1

## 2016-01-06 MED ORDER — IBUPROFEN 600 MG PO TABS: 600.0000 mg | ORAL_TABLET | Freq: Four times a day (QID) | ORAL | Status: DC | PRN
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
## 2016-01-06 NOTE — Discharge Instructions (Signed)
1. Medications: ibuprofen as needed for pain, continue usual home medications 2. Treatment: rest, drink plenty of fluids, ice put as needed for additional pain relief (instructions below) 3. Follow Up: Please follow up with your primary doctor in 3-5 days for discussion of your diagnoses and further evaluation after today's visit; Please return to the ER for new or worsening symptoms, any additional concerns.   COLD THERAPY DIRECTIONS:  Ice or gel packs can be used to reduce both pain and swelling. Ice is the most helpful within the first 24 to 48 hours after an injury or flareup from overusing a muscle or joint.  Ice is effective, has very few side effects, and is safe for most people to use.   If you expose your skin to cold temperatures for too long or without the proper protection, you can damage your skin or nerves. Watch for signs of skin damage due to cold.   HOME CARE INSTRUCTIONS  Follow these tips to use ice and cold packs safely.  Place a dry or damp towel between the ice and skin. A damp towel will cool the skin more quickly, so you may need to shorten the time that the ice is used.  For a more rapid response, add gentle compression to the ice.  Ice for no more than 10 to 20 minutes at a time. The bonier the area you are icing, the less time it will take to get the benefits of ice.  Check your skin after 5 minutes to make sure there are no signs of a poor response to cold or skin damage.  Rest 20 minutes or more in between uses.  Once your skin is numb, you can end your treatment. You can test numbness by very lightly touching your skin. The touch should be so light that you do not see the skin dimple from the pressure of your fingertip. When using ice, most people will feel these normal sensations in this order: cold, burning, aching, and numbness.
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
## 2016-01-06 NOTE — ED Notes (Signed)
Patient called x 2 with no answer.  Registration clerk states she walked outside to smoke,

## 2017-01-26 ENCOUNTER — Encounter (HOSPITAL_COMMUNITY): Payer: Self-pay | Admitting: Emergency Medicine

## 2017-01-26 ENCOUNTER — Emergency Department (HOSPITAL_COMMUNITY)
Admission: EM | Admit: 2017-01-26 | Discharge: 2017-01-26 | Disposition: A | Discharge status: Home / Self Care | Payer: Self-pay | Attending: Emergency Medicine | Admitting: Emergency Medicine

## 2017-01-26 DIAGNOSIS — N938 Other specified abnormal uterine and vaginal bleeding: Secondary | ICD-10-CM | POA: Insufficient documentation

## 2017-01-26 DIAGNOSIS — F17210 Nicotine dependence, cigarettes, uncomplicated: Secondary | ICD-10-CM | POA: Insufficient documentation

## 2017-01-26 DIAGNOSIS — Z79899 Other long term (current) drug therapy: Secondary | ICD-10-CM | POA: Insufficient documentation

## 2017-01-26 LAB — BASIC METABOLIC PANEL
ANION GAP: 6 (ref 5–15)
BUN: <5 mg/dL — ABNORMAL LOW (ref 6–20)
CHLORIDE: 107 mmol/L (ref 101–111)
CO2: 25 mmol/L (ref 22–32)
Calcium: 8.6 mg/dL — ABNORMAL LOW (ref 8.9–10.3)
Creatinine, Ser: 0.67 mg/dL (ref 0.44–1.00)
GFR calc Af Amer: >60 mL/min (ref >60)
GLUCOSE: 89 mg/dL (ref 65–99)
POTASSIUM: 3.8 mmol/L (ref 3.5–5.1)
Sodium: 138 mmol/L (ref 135–145)

## 2017-01-26 LAB — WET PREP, GENITAL
Clue Cells Wet Prep HPF POC: NONE SEEN
SPERM: NONE SEEN
Trich, Wet Prep: NONE SEEN
YEAST WET PREP: NONE SEEN

## 2017-01-26 LAB — CBC WITH DIFFERENTIAL/PLATELET
BASOS ABS: 0 10*3/uL (ref 0.0–0.1)
Basophils Relative: 0 %
Eosinophils Absolute: 0.4 10*3/uL (ref 0.0–0.7)
Eosinophils Relative: 5 %
HEMATOCRIT: 38.1 % (ref 36.0–46.0)
HEMOGLOBIN: 12.6 g/dL (ref 12.0–15.0)
LYMPHS PCT: 22 %
Lymphs Abs: 1.9 10*3/uL (ref 0.7–4.0)
MCH: 32 pg (ref 26.0–34.0)
MCHC: 33.1 g/dL (ref 30.0–36.0)
MCV: 96.7 fL (ref 78.0–100.0)
Monocytes Absolute: 0.7 10*3/uL (ref 0.1–1.0)
Monocytes Relative: 8 %
NEUTROS ABS: 5.8 10*3/uL (ref 1.7–7.7)
Neutrophils Relative %: 65 %
Platelets: 374 10*3/uL (ref 150–400)
RBC: 3.94 MIL/uL (ref 3.87–5.11)
RDW: 13.1 % (ref 11.5–15.5)
WBC: 8.9 10*3/uL (ref 4.0–10.5)

## 2017-01-26 LAB — POC URINE PREG, ED: Preg Test, Ur: NEGATIVE

## 2017-01-26 MED ORDER — MEGESTROL ACETATE 40 MG PO TABS: 40.0000 mg | ORAL_TABLET | Freq: Three times a day (TID) | ORAL | 0 refills | Status: DC

## 2017-01-26 MED ORDER — MEGESTROL ACETATE 40 MG PO TABS
40.0000 mg | ORAL_TABLET | Freq: Once | ORAL | Status: AC
  Administered 2017-01-26: 40 mg via ORAL
  Filled 2017-01-26: qty 1

## 2017-01-26 MED ORDER — ACETAMINOPHEN 500 MG PO TABS: 1000.0000 mg | ORAL_TABLET | Freq: Four times a day (QID) | ORAL | 0 refills | Status: DC | PRN

## 2017-01-26 MED ORDER — KETOROLAC TROMETHAMINE 30 MG/ML IJ SOLN
30.0000 mg | Freq: Once | INTRAMUSCULAR | Status: AC
  Administered 2017-01-26: 30 mg via INTRAMUSCULAR
  Filled 2017-01-26: qty 1

## 2017-01-26 MED ORDER — ACETAMINOPHEN 500 MG PO TABS
1000.0000 mg | ORAL_TABLET | Freq: Once | ORAL | Status: AC
  Administered 2017-01-26: 1000 mg via ORAL
  Filled 2017-01-26: qty 2

## 2017-01-26 NOTE — Discharge Instructions (Signed)
Please read instructions below. [facility] You can take Tylenol as needed for pain. Do not take more that 4g (4000mg ) per day! Begin taking the Megace (megestrol) 3 times per day until bleeding stops. Once your bleeding stops take this medication 1 time per day until you are seen in the women's clinic.

## 2017-01-26 NOTE — ED Provider Notes (Signed)
[facility]-EMERGENCY DEPT Provider Note   CSN: [id number] Arrival date & time: 01/26/17  1631   By signing my name below, I, Teofilo Pod, attest that this documentation has been prepared under the direction and in the presence of [place] Russo, [place]. Electronically Signed: Teofilo Pod, ED Scribe. 01/26/2017. 5:09 PM.   History   Chief Complaint Chief Complaint  Patient presents with  . Vaginal Bleeding    The history is provided by the patient. No language interpreter was used.   HPI Comments:  Olivia Mcdaniel is a 36 y.o. female who presents to the Emergency Department complaining of constant vaginal bleeding x 2 days. Pt states that the bleeding has been very heavy and "like water." She reports having to change her tampons every 20 minutes. She states that her period began 6 days ago and ended 3 days ago. She reports having abnormal periods regularly. Pt complains of associated lightheadedness, constant lower abdominal cramping and lower back pain. No alleviating factors noted. Pt denies dysuria, fever, vaginal discharge, diarrhea.   Past Medical History:  Diagnosis Date  . Complication of anesthesia   . Gallstones   . PONV (postoperative nausea and vomiting)     Patient Active Problem List   Diagnosis Date Noted  . Cholecystitis 06/17/2015    Past Surgical History:  Procedure Laterality Date  . CHOLECYSTECTOMY  06/17/2015  . CHOLECYSTECTOMY N/A 06/17/2015   Procedure: LAPAROSCOPIC CHOLECYSTECTOMY;  Surgeon: Emelia Loron, MD;  Location: [facility] OR;  Service: General;  Laterality: N/A;    OB History    No data available       Home Medications    Prior to Admission medications   Medication Sig Start Date End Date Taking? Authorizing Provider  acetaminophen (TYLENOL) 500 MG tablet Take 2 tablets (1,000 mg total) by mouth every 6 (six) hours as needed. 01/26/17   Russo, [place] N, PA-C  albuterol (PROVENTIL HFA;VENTOLIN HFA) 108 (90 BASE) MCG/ACT inhaler  Inhale 2 puffs into the lungs every 6 (six) hours as needed for wheezing. For shortness of breath Patient not taking: Reported on 06/17/2015 10/15/12   Johnnette Gourd, MD  ibuprofen (ADVIL,MOTRIN) 600 MG tablet Take 1 tablet (600 mg total) by mouth every 6 (six) hours as needed. 01/06/16   Ward, Chase Picket, PA-C  megestrol (MEGACE) 40 MG tablet Take 1 tablet (40 mg total) by mouth 3 (three) times daily. Take 1 tablet 3 times per day until bleeding stops. Then take 1 tablet 1 time per day until your visit with the women's clinic. 01/26/17   Russo, [place] N, PA-C  naproxen (NAPROSYN) 250 MG tablet Take 1 tablet (250 mg total) by mouth 2 (two) times daily with a meal. 07/28/15   Everlene Farrier, PA-C    Family History History reviewed. No pertinent family history.  Social History Social History  Substance Use Topics  . Smoking status: Current Every Day Smoker    Packs/day: 1.00    Types: Cigarettes  . Smokeless tobacco: Never Used  . Alcohol use No     Allergies   Penicillins   Review of Systems Review of Systems  Constitutional: Negative for fever.  Gastrointestinal: Positive for abdominal pain. Negative for diarrhea.  Genitourinary: Positive for pelvic pain and vaginal bleeding. Negative for dysuria and vaginal discharge.  Musculoskeletal: Positive for back pain.     Physical Exam Updated Vital Signs BP 98/71   Pulse 90   Temp 98 F (36.7 C) (Oral)   Resp 16   SpO2  99%   Physical Exam  Constitutional: She appears well-developed and well-nourished. No distress.  HENT:  Head: Normocephalic and atraumatic.  Eyes: Conjunctivae are normal.  Cardiovascular: Normal rate, regular rhythm, normal heart sounds and intact distal pulses.  Exam reveals no friction rub.   No murmur heard. Pulmonary/Chest: Effort normal and breath sounds normal. No respiratory distress. She has no wheezes. She has no rales.  Abdominal: Soft. Bowel sounds are normal. She exhibits no distension. There is no  rebound and no guarding.  Mild diffuse abdominal tenderness. Moderate suprapubic abdominal tenderness.    Genitourinary: Vagina normal. There is no rash or tenderness on the right labia. There is no rash or tenderness on the left labia. Uterus is enlarged. Cervix exhibits motion tenderness. Right adnexum displays tenderness. Left adnexum displays tenderness. No erythema or bleeding in the vagina.  Genitourinary Comments: Exam performed with RN chaperone present. Cervix and vagina visualized and bloodless field.  Neurological: She is alert.  Skin: Skin is warm. No pallor.  Psychiatric: She has a normal mood and affect. Her behavior is normal.  Nursing note and vitals reviewed.    ED Treatments / Results  DIAGNOSTIC STUDIES:  Oxygen Saturation is 99% on RA, normal by my interpretation.    COORDINATION OF CARE:  5:07 PM Discussed treatment plan with pt at bedside and pt agreed to plan.   Labs (all labs ordered are listed, but only abnormal results are displayed) Labs Reviewed  WET PREP, GENITAL - Abnormal; Notable for the following:       Result Value   WBC, Wet Prep HPF POC MODERATE (*)    All other components within normal limits  BASIC METABOLIC PANEL - Abnormal; Notable for the following:    BUN <5 (*)    Calcium 8.6 (*)    All other components within normal limits  CBC WITH DIFFERENTIAL/PLATELET  POC URINE PREG, ED  GC/CHLAMYDIA PROBE AMP ([facility]) NOT AT [facility]    EKG  EKG Interpretation None       Radiology No results found.  Procedures Procedures (including critical care time)  Medications Ordered in ED Medications  megestrol (MEGACE) tablet 40 mg (not administered)  acetaminophen (TYLENOL) tablet 1,000 mg (1,000 mg Oral Given 01/26/17 1806)  ketorolac (TORADOL) 30 MG/ML injection 30 mg (30 mg Intramuscular Given 01/26/17 1844)     Initial Impression / Assessment and Plan / ED Course  I have reviewed the triage vital signs and the nursing  notes.  Pertinent labs & imaging results that were available during my care of the patient were reviewed by me and considered in my medical decision making (see chart for details).     Patient with dysfunctional uterine bleeding. Patient with negative urine preg, hemoglobin normal. Pelvic exam with tenderness and enlarged uterus, not exsanguinating. Pain controlled in ED with Tylenol and Toradol. Consulted Dr. Emelda FearFerguson on call for GYN; recommends Megace 40 mg 3 times a day until bleeding stops, then daily until seen in outpatient women's clinic. Patient is hemodynamically stable, safe for discharge home.  Patient discussed with Dr. Adela LankFloyd, who agrees with care plan. Discussed results, findings, treatment and follow up. Patient advised of return precautions. Patient verbalized understanding and agreed with plan.   Final Clinical Impressions(s) / ED Diagnoses   Final diagnoses:  Dysfunctional uterine bleeding    New Prescriptions New Prescriptions   ACETAMINOPHEN (TYLENOL) 500 MG TABLET    Take 2 tablets (1,000 mg total) by mouth every 6 (six) hours as needed.  MEGESTROL (MEGACE) 40 MG TABLET    Take 1 tablet (40 mg total) by mouth 3 (three) times daily. Take 1 tablet 3 times per day until bleeding stops. Then take 1 tablet 1 time per day until your visit with the women's clinic.  I personally performed the services described in this documentation, which was scribed in my presence. The recorded information has been reviewed and is accurate.    Russo, [place] N, PA-C 01/26/17 1846    Melene Plan, DO 01/26/17 1857

## 2017-01-26 NOTE — ED Triage Notes (Signed)
Per EMS: pt from home c/o lower abd cramping and heavy vaginal bleeding

## 2017-01-27 LAB — GC/CHLAMYDIA PROBE AMP (~~LOC~~) NOT AT ARMC
Chlamydia: NEGATIVE
Neisseria Gonorrhea: NEGATIVE

## 2017-03-03 ENCOUNTER — Ambulatory Visit: Payer: Self-pay | Admitting: Obstetrics & Gynecology

## 2017-03-04 ENCOUNTER — Emergency Department (HOSPITAL_COMMUNITY)
Admission: EM | Admit: 2017-03-04 | Discharge: 2017-03-04 | Disposition: A | Discharge status: Home / Self Care | Payer: Self-pay | Attending: Emergency Medicine | Admitting: Emergency Medicine

## 2017-03-04 ENCOUNTER — Emergency Department (HOSPITAL_COMMUNITY): Payer: Self-pay

## 2017-03-04 ENCOUNTER — Encounter (HOSPITAL_COMMUNITY): Payer: Self-pay

## 2017-03-04 DIAGNOSIS — K0889 Other specified disorders of teeth and supporting structures: Secondary | ICD-10-CM | POA: Insufficient documentation

## 2017-03-04 DIAGNOSIS — F17210 Nicotine dependence, cigarettes, uncomplicated: Secondary | ICD-10-CM | POA: Insufficient documentation

## 2017-03-04 DIAGNOSIS — Z79899 Other long term (current) drug therapy: Secondary | ICD-10-CM | POA: Insufficient documentation

## 2017-03-04 DIAGNOSIS — Z79818 Long term (current) use of other agents affecting estrogen receptors and estrogen levels: Secondary | ICD-10-CM | POA: Insufficient documentation

## 2017-03-04 LAB — BASIC METABOLIC PANEL
Anion gap: 7 (ref 5–15)
BUN: <5 mg/dL — ABNORMAL LOW (ref 6–20)
CO2: 25 mmol/L (ref 22–32)
Calcium: 9.2 mg/dL (ref 8.9–10.3)
Chloride: 104 mmol/L (ref 101–111)
Creatinine, Ser: 0.59 mg/dL (ref 0.44–1.00)
GFR calc Af Amer: >60 mL/min (ref >60)
GFR calc non Af Amer: >60 mL/min (ref >60)
Glucose, Bld: 112 mg/dL — ABNORMAL HIGH (ref 65–99)
Potassium: 3.7 mmol/L (ref 3.5–5.1)
Sodium: 136 mmol/L (ref 135–145)

## 2017-03-04 LAB — CBC
HCT: 39.2 % (ref 36.0–46.0)
Hemoglobin: 12.8 g/dL (ref 12.0–15.0)
MCH: 31.3 pg (ref 26.0–34.0)
MCHC: 32.7 g/dL (ref 30.0–36.0)
MCV: 95.8 fL (ref 78.0–100.0)
Platelets: 505 10*3/uL — ABNORMAL HIGH (ref 150–400)
RBC: 4.09 MIL/uL (ref 3.87–5.11)
RDW: 13.3 % (ref 11.5–15.5)
WBC: 17 10*3/uL — ABNORMAL HIGH (ref 4.0–10.5)

## 2017-03-04 LAB — I-STAT BETA HCG BLOOD, ED (MC, WL, AP ONLY): I-stat hCG, quantitative: <5 m[IU]/mL (ref <5)

## 2017-03-04 LAB — I-STAT TROPONIN, ED: Troponin i, poc: 0 ng/mL (ref 0.00–0.08)

## 2017-03-04 MED ORDER — CLINDAMYCIN HCL 150 MG PO CAPS
450.0000 mg | ORAL_CAPSULE | Freq: Once | ORAL | Status: AC
  Administered 2017-03-04: 450 mg via ORAL
  Filled 2017-03-04: qty 3

## 2017-03-04 MED ORDER — OXYCODONE-ACETAMINOPHEN 5-325 MG PO TABS: 2.0000 | ORAL_TABLET | ORAL | 0 refills | Status: AC | PRN

## 2017-03-04 MED ORDER — CLINDAMYCIN HCL 150 MG PO CAPS: 450.0000 mg | ORAL_CAPSULE | Freq: Four times a day (QID) | ORAL | 0 refills | Status: AC

## 2017-03-04 MED ORDER — OXYCODONE HCL 5 MG PO TABS
5.0000 mg | ORAL_TABLET | Freq: Once | ORAL | Status: AC
  Administered 2017-03-04: 5 mg via ORAL
  Filled 2017-03-04: qty 1

## 2017-03-04 MED ORDER — KETOROLAC TROMETHAMINE 30 MG/ML IJ SOLN
30.0000 mg | Freq: Once | INTRAMUSCULAR | Status: AC
  Administered 2017-03-04: 30 mg via INTRAVENOUS
  Filled 2017-03-04: qty 1
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
## 2017-03-04 NOTE — ED Provider Notes (Signed)
[facility]-EMERGENCY DEPT Provider Note   CSN: [id number] Arrival date & time: 03/04/17  1215     History   Chief Complaint Chief Complaint  Patient presents with  . Dental Pain  . Dizziness    HPI Olivia Mcdaniel is a 36 y.o. female is brought to the ED by EMS for dental and mouth pain 1 month. Patient having difficulty eating due to pain. No associated fevers, purulent discharge, gum bleeding, shortness of breath, drooling, changes in voice, neck stiffness. Patient has tried ibuprofen without relief. Upon me entering the room, patient is screaming in pain. History of cocaine and tobacco abuse.  HPI  Past Medical History:  Diagnosis Date  . Complication of anesthesia   . Gallstones   . PONV (postoperative nausea and vomiting)     Patient Active Problem List   Diagnosis Date Noted  . Cholecystitis 06/17/2015    Past Surgical History:  Procedure Laterality Date  . CHOLECYSTECTOMY  06/17/2015  . CHOLECYSTECTOMY N/A 06/17/2015   Procedure: LAPAROSCOPIC CHOLECYSTECTOMY;  Surgeon: Emelia Loron, MD;  Location: [facility] OR;  Service: General;  Laterality: N/A;    OB History    No data available       Home Medications    Prior to Admission medications   Medication Sig Start Date End Date Taking? Authorizing Provider  acetaminophen (TYLENOL) 500 MG tablet Take 2 tablets (1,000 mg total) by mouth every 6 (six) hours as needed. 01/26/17   Russo, [place] N, PA-C  albuterol (PROVENTIL HFA;VENTOLIN HFA) 108 (90 BASE) MCG/ACT inhaler Inhale 2 puffs into the lungs every 6 (six) hours as needed for wheezing. For shortness of breath Patient not taking: Reported on 06/17/2015 10/15/12   Johnnette Gourd, MD  clindamycin (CLEOCIN) 150 MG capsule Take 3 capsules (450 mg total) by mouth every 6 (six) hours. 03/04/17 03/11/17  Liberty Handy, PA-C  ibuprofen (ADVIL,MOTRIN) 600 MG tablet Take 1 tablet (600 mg total) by mouth every 6 (six) hours as needed. 01/06/16   Ward, Chase Picket, PA-C    megestrol (MEGACE) 40 MG tablet Take 1 tablet (40 mg total) by mouth 3 (three) times daily. Take 1 tablet 3 times per day until bleeding stops. Then take 1 tablet 1 time per day until your visit with the women's clinic. 01/26/17   Russo, [place] N, PA-C  naproxen (NAPROSYN) 250 MG tablet Take 1 tablet (250 mg total) by mouth 2 (two) times daily with a meal. 07/28/15   Everlene Farrier, PA-C  oxyCODONE-acetaminophen (PERCOCET/ROXICET) 5-325 MG tablet Take 2 tablets by mouth every 4 (four) hours as needed for severe pain. 03/04/17 03/06/17  Liberty Handy, PA-C    Family History No family history on file.  Social History Social History  Substance Use Topics  . Smoking status: Current Every Day Smoker    Packs/day: 1.00    Types: Cigarettes  . Smokeless tobacco: Never Used  . Alcohol use No     Allergies   Penicillins   Review of Systems Review of Systems  Constitutional: Negative for fever.  HENT: Positive for dental problem. Negative for congestion, drooling, facial swelling, mouth sores, sore throat and trouble swallowing.   Respiratory: Negative for cough and shortness of breath.   Cardiovascular: Negative for chest pain.  Gastrointestinal: Negative for abdominal pain, nausea and vomiting.  Genitourinary: Negative for difficulty urinating.  Musculoskeletal: Negative for neck pain and neck stiffness.  Neurological: Positive for headaches.     Physical Exam Updated Vital Signs BP 123/80  Pulse 99   Temp 98.2 F (36.8 C) (Oral)   Resp 17   LMP 02/01/2017 (Within Weeks)   SpO2 99%   Physical Exam  Constitutional: She is oriented to person, place, and time. She appears well-developed and well-nourished. No distress.  NAD.  HENT:  Head: Normocephalic and atraumatic.  Right Ear: External ear normal.  Left Ear: External ear normal.  Nose: Nose normal.  Mouth/Throat: Abnormal dentition. Dental caries present. No dental abscesses.  Significant dental decay, multiple  missing teeth, mild gingival erythema, edema and hypertrophy all throughout Oral airway is widely patent No pooling of oral secretions No sublingual edema or tenderness No anterior neck edema or tenderness No trismus No evidence of gum line fluctuance or abscess Soft palate flat without tenderness.  Phonation normal, no hot potato voice.  Maxilla and mandible nontender. Mastoids without edema, erythema or tenderness.    Eyes: Conjunctivae and EOM are normal. No scleral icterus.  Neck: Normal range of motion. Neck supple.  Cardiovascular: Normal rate, regular rhythm and normal heart sounds.   No murmur heard. Pulmonary/Chest: Effort normal and breath sounds normal. She has no wheezes.  Musculoskeletal: Normal range of motion. She exhibits no deformity.  Neurological: She is alert and oriented to person, place, and time.  Skin: Skin is warm and dry. Capillary refill takes less than 2 seconds.  Psychiatric: She has a normal mood and affect. Her behavior is normal. Judgment and thought content normal.  Nursing note and vitals reviewed.    ED Treatments / Results  Labs (all labs ordered are listed, but only abnormal results are displayed) Labs Reviewed  BASIC METABOLIC PANEL - Abnormal; Notable for the following:       Result Value   Glucose, Bld 112 (*)    BUN <5 (*)    All other components within normal limits  CBC - Abnormal; Notable for the following:    WBC 17.0 (*)    Platelets 505 (*)    All other components within normal limits  I-STAT TROPOININ, ED  I-STAT BETA HCG BLOOD, ED ([facility], [facility], [facility] ONLY)    EKG  EKG Interpretation  Date/Time:  Friday March 04 2017 12:32:45 EDT Ventricular Rate:  91 PR Interval:  118 QRS Duration: 80 QT Interval:  364 QTC Calculation: 447 R Axis:   74 Text Interpretation:  Normal sinus rhythm with sinus arrhythmia Normal ECG Confirmed by Juleen ChinaKOHUT  MD, STEPHEN [phone]([id number]) on 03/04/2017 3:00:04 PM       Radiology Dg Chest 2 View  Result Date:  03/04/2017 CLINICAL DATA:  Chest pain, shortness of breath EXAM: CHEST  2 VIEW COMPARISON:  None. FINDINGS: The heart size and mediastinal contours are within normal limits. Both lungs are clear. The visualized skeletal structures are unremarkable. IMPRESSION: No active cardiopulmonary disease. Electronically Signed   By: Elige KoHetal  Patel   On: 03/04/2017 13:26    Procedures Procedures (including critical care time)  Medications Ordered in ED Medications  ketorolac (TORADOL) 30 MG/ML injection 30 mg (30 mg Intravenous Given 03/04/17 1532)  oxyCODONE (Oxy IR/ROXICODONE) immediate release tablet 5 mg (5 mg Oral Given 03/04/17 1530)  clindamycin (CLEOCIN) capsule 450 mg (450 mg Oral Given 03/04/17 1530)     Initial Impression / Assessment and Plan / ED Course  I have reviewed the triage vital signs and the nursing notes.  Pertinent labs & imaging results that were available during my care of the patient were reviewed by me and considered in my medical decision making (see  chart for details).  Clinical Course as of Mar 04 1648  Fri Mar 04, 2017  1458 WBC: (!) 17.0 [CG]    Clinical Course User Index [CG] Liberty Handy, PA-C   36 year old female with history of tobacco abuse, cocaine abuse presents to the ED with dental and mouth pain 1 month. On exam she has significant dental decay, multiple missing teeth, with mild gingival erythema and edema with hypertrophy but no obvious signs of dental abscess. HEENT exam overall reassuring, doubt deep neck infection or Ludwig's.  At triage, there was a concern the patient had syncopized, after clarification patient was only laying on the floor in tears from her dental/mouth pain. Triage ordered lab work is reassuring, she does have elevated WBC at 17 however she has no systemic symptoms including fever, night sweats or obvious signs of dental abscess or deep neck infection. Given significant dental decay she is at high risk for dental infection so we will  discharge with antibiotics today. Encouraged her to follow up with dentist in 2 days. Given her significant distress and obvious significant dental decay that looks very painful I think it prudent to discharge her with a short course of Percocet until she is able to see dentist. I had long discussion with patient regarding repeat prescription of narcotic pain medicines from the ED, she is aware that we cannot repeatedly prescribe these medicines in the emergency department. Patient was reasonable, requested food and water prior to discharge. She tolerated by mouth challenge without difficulty.  Final Clinical Impressions(s) / ED Diagnoses   Final diagnoses:  Pain, dental    New Prescriptions Discharge Medication List as of 03/04/2017  3:42 PM    START taking these medications   Details  clindamycin (CLEOCIN) 150 MG capsule Take 3 capsules (450 mg total) by mouth every 6 (six) hours., Starting Fri 03/04/2017, Until Fri 03/11/2017, Print    oxyCODONE-acetaminophen (PERCOCET/ROXICET) 5-325 MG tablet Take 2 tablets by mouth every 4 (four) hours as needed for severe pain., Starting Fri 03/04/2017, Until Sun 03/06/2017, Print         Liberty Handy, PA-C 03/04/17 1649    Raeford Razor, MD 03/11/17 1901

## 2017-03-04 NOTE — ED Triage Notes (Signed)
Pt presents via [facility] for evaluation of dental pain x 4 months and dizziness. EMS reports pt became upset when she discussed dental pain and began hyperventilating.

## 2017-03-04 NOTE — ED Notes (Addendum)
Pt heard crying in lobby bathroom; when this tech went to check on her, pt found sitting on bathroom floor; this tech and another tech helped pt into wheelchair and brought pt to triage.

## 2017-09-14 ENCOUNTER — Encounter (HOSPITAL_COMMUNITY): Payer: Self-pay | Admitting: *Deleted

## 2017-09-14 ENCOUNTER — Emergency Department (HOSPITAL_COMMUNITY)
Admission: EM | Admit: 2017-09-14 | Discharge: 2017-09-14 | Discharge status: Against Medical Advice | Payer: Self-pay | Attending: Emergency Medicine | Admitting: Emergency Medicine

## 2017-09-14 DIAGNOSIS — N939 Abnormal uterine and vaginal bleeding, unspecified: Secondary | ICD-10-CM | POA: Insufficient documentation

## 2017-09-14 DIAGNOSIS — Z5321 Procedure and treatment not carried out due to patient leaving prior to being seen by health care provider: Secondary | ICD-10-CM | POA: Insufficient documentation

## 2017-09-14 NOTE — ED Notes (Signed)
Unable to collect labs I called patient name in the lobby and no one responded 

## 2017-09-14 NOTE — ED Triage Notes (Signed)
Pt complains of vaginal bleeding since last night. Pt states the bleeding started after having sex.

## 2017-09-17 ENCOUNTER — Inpatient Hospital Stay (HOSPITAL_COMMUNITY)
Admission: AD | Admit: 2017-09-17 | Discharge: 2017-09-17 | Disposition: A | Discharge status: Home / Self Care | Payer: Self-pay | Source: Ambulatory Visit | Attending: Obstetrics and Gynecology | Admitting: Obstetrics and Gynecology

## 2017-09-17 ENCOUNTER — Encounter (HOSPITAL_COMMUNITY): Payer: Self-pay

## 2017-09-17 DIAGNOSIS — Z880 Allergy status to penicillin: Secondary | ICD-10-CM | POA: Insufficient documentation

## 2017-09-17 DIAGNOSIS — Z3202 Encounter for pregnancy test, result negative: Secondary | ICD-10-CM | POA: Insufficient documentation

## 2017-09-17 DIAGNOSIS — O9989 Other specified diseases and conditions complicating pregnancy, childbirth and the puerperium: Secondary | ICD-10-CM

## 2017-09-17 DIAGNOSIS — R1084 Generalized abdominal pain: Secondary | ICD-10-CM | POA: Insufficient documentation

## 2017-09-17 DIAGNOSIS — N939 Abnormal uterine and vaginal bleeding, unspecified: Secondary | ICD-10-CM | POA: Insufficient documentation

## 2017-09-17 DIAGNOSIS — F17210 Nicotine dependence, cigarettes, uncomplicated: Secondary | ICD-10-CM | POA: Insufficient documentation

## 2017-09-17 HISTORY — DX: Other specified health status: Z78.9

## 2017-09-17 LAB — CBC
HEMATOCRIT: 39.6 % (ref 36.0–46.0)
HEMOGLOBIN: 13.2 g/dL (ref 12.0–15.0)
MCH: 31.4 pg (ref 26.0–34.0)
MCHC: 33.3 g/dL (ref 30.0–36.0)
MCV: 94.3 fL (ref 78.0–100.0)
Platelets: 331 10*3/uL (ref 150–400)
RBC: 4.2 MIL/uL (ref 3.87–5.11)
RDW: 14.8 % (ref 11.5–15.5)
WBC: 9 10*3/uL (ref 4.0–10.5)

## 2017-09-17 LAB — RAPID URINE DRUG SCREEN, HOSP PERFORMED
Amphetamines: NOT DETECTED
Barbiturates: NOT DETECTED
Benzodiazepines: NOT DETECTED
Cocaine: POSITIVE — AB
OPIATES: NOT DETECTED
Tetrahydrocannabinol: NOT DETECTED

## 2017-09-17 LAB — URINALYSIS, ROUTINE W REFLEX MICROSCOPIC
BILIRUBIN URINE: NEGATIVE
GLUCOSE, UA: NEGATIVE mg/dL
Ketones, ur: NEGATIVE mg/dL
NITRITE: NEGATIVE
PROTEIN: NEGATIVE mg/dL
Specific Gravity, Urine: 1.011 (ref 1.005–1.030)
pH: 5 (ref 5.0–8.0)

## 2017-09-17 LAB — WET PREP, GENITAL
CLUE CELLS WET PREP: NONE SEEN
Sperm: NONE SEEN
TRICH WET PREP: NONE SEEN
YEAST WET PREP: NONE SEEN

## 2017-09-17 LAB — HCG, SERUM, QUALITATIVE: Preg, Serum: NEGATIVE

## 2017-09-17 LAB — POCT PREGNANCY, URINE: PREG TEST UR: NEGATIVE

## 2017-09-17 NOTE — Progress Notes (Addendum)
G2 P2 @ early pregnancy at about 14wks per pt. States went to pregn center two days ago and informed pt that she was [redacted] wksga.   Presents to triage dt scant bleeding that started this morning around 1100. Lower abd cramps.   Current smoker. Smokes 3 cigs per day  Doppler: unable to obtain  UPT negative.  1347: provider at bs assessing. Wetprep, and GC and pelvic done. Labs ordered  1500: Family enquiring about blood work.   1510: D/c instructions given with pt understanding. Pt left unit via ambulatory with SO. Crackers and drink given upon exiting.

## 2017-09-17 NOTE — Discharge Instructions (Signed)
In late 2019, the [facility] will be moving to the [facility] campus. At that time, the MAU (Maternity Admissions Unit), where you are being seen today, will no longer take care of non-pregnant patients. We strongly encourage you to find a doctor's office before that time, so that you can be seen with any GYN concerns, like vaginal discharge, urinary tract infection, etc.. in a timely manner.  In order to make an office visit more convenient, the Center for [facility] at [place] will be offering evening hours with same-day appointments, walk-in appointments and scheduled appointments available during this time.  Center for [facility] Hours: Monday - 8am - 7:30 pm with walk-in between 4pm- 7:30 pm Tuesday - 8 am - 5 pm (starting 12/06/17 we will be open late and accepting walk-ins from 4pm - 7:30pm) Wednesday - 8 am - 5 pm (starting 03/08/18 we will be open late and accepting walk-ins from 4pm - 7:30pm) Thursday 8 am - 5 pm (starting 06/08/18 we will be open late and accepting walk-ins from 4pm - 7:30pm) Friday 8 am - 5 pm  For an appointment please call the Center for [facility] @ [facility] at [phone]  For urgent needs, [facility] is also available for management of urgent GYN complaints such as vaginal discharge or urinary tract infections.     [facility] [employer] for [employer] at [facility]       Phone: [phone]  Center for [employer] at [employer] Phone: [phone]  Center for [employer] at [place]  Phone: [phone]  Center for [employer] at [employer]  Phone: [phone]  Center for [facility] [place]  Phone: [phone]  [facility]       Phone: [phone]  [facility]    Phone: [phone]   [facility] [place])    Phone: [phone]  [facility]    Phone: [phone]  [facility]    Phone: [phone]  [facility]    Phone: [phone]  [facility]       Phone: [phone]   [facility]  Phone: [phone]  [facility]    Phone: [phone]  [facility] [place]   Phone: [phone]  [facility]      Phone: [phone]  [facility]    Phone: [phone]  Secondary Amenorrhea Secondary amenorrhea is the stopping of menstrual flow for 3-6 months in a female who has previously had periods. There are many possible causes. Most of these causes are not serious. Usually, treating the underlying problem causing the loss of menses will return your periods to normal. What are the causes? Some common and uncommon causes of not menstruating include:  Malnutrition.  Low blood sugar (hypoglycemia).  Polycystic ovary disease.  Stress or fear.  Breastfeeding.  Hormone imbalance.  Ovarian failure.  Medicines.  Extreme obesity.  Cystic fibrosis.  Low body weight or drastic weight reduction from any cause.  Early menopause.  Removal of ovaries or uterus.  Contraceptives.  Illness.  Long-term (chronic) illnesses.  Cushing syndrome.  Thyroid problems.  Birth control pills, patches, or vaginal rings for birth control.  What increases the risk? You may be at greater risk of secondary amenorrhea if:  You have a family history of this condition.  You have an eating disorder.  You  do athletic training.  How is this diagnosed? A diagnosis is made by your health care provider taking a medical history and doing a physical exam. This will include a pelvic exam to check for problems with your reproductive organs. Pregnancy must be ruled out. Often, numerous blood tests are done to measure different hormones in the  body. Urine testing may be done. Specialized exams (ultrasound, CT scan, MRI, or hysteroscopy) may have to be done as well as measuring the body mass index (BMI). How is this treated? Treatment depends on the cause of the amenorrhea. If an eating disorder is present, this can be treated with an adequate diet and therapy. Chronic illnesses may improve with treatment of the illness. Amenorrhea may be corrected with medicines, lifestyle changes, or surgery. If the amenorrhea cannot be corrected, it is sometimes possible to create a false menstruation with medicines. Follow these instructions at home:  Maintain a healthy diet.  Manage weight problems.  Exercise regularly but not excessively.  Get adequate sleep.  Manage stress.  Be aware of changes in your menstrual cycle. Keep a record of when your periods occur. Note the date your period starts, how long it lasts, and any problems. Contact a health care provider if: Your symptoms do not get better with treatment. This information is not intended to replace advice given to you by your health care provider. Make sure you discuss any questions you have with your health care provider. Document Released: 10/04/2006 Document Revised: 01/29/2016 Document Reviewed: 02/08/2013 Elsevier Interactive Patient Education  [employer]2018 Elsevier Inc.

## 2017-09-17 NOTE — MAU Provider Note (Signed)
History     CSN: [id number]  Arrival date and time: 09/17/17 1253   First Provider Initiated Contact with Patient 09/17/17 1335     Chief Complaint  Patient presents with  . Vaginal Bleeding   HPI Olivia Mcdaniel is a 37 y.o. G2P0202 non pregnant female who presents with abdominal pain and vaginal bleeding. She states she went to the pregnancy care center on Tuesday where they told her that based on her last period she is [redacted] weeks pregnant. She states she is having bleeding after intercourse and lower abdominal cramping. She is very concerned because she feels like her abdomen is growing and she "has all the signs of pregnancy." LMP October 2018.  OB History    Gravida Para Term Preterm AB Living   2 2 0 2   2   SAB TAB Ectopic Multiple Live Births                  Obstetric Comments   SVD at 36 wks. Not in the NICU. Delivered one in [place] and one at [facility] in [place]      Past Medical History:  Diagnosis Date  . Gallstones   . Medical history non-contributory     Past Surgical History:  Procedure Laterality Date  . CHOLECYSTECTOMY  06/17/2015  . CHOLECYSTECTOMY N/A 06/17/2015   Procedure: LAPAROSCOPIC CHOLECYSTECTOMY;  Surgeon: Emelia Loron, MD;  Location: [facility] OR;  Service: General;  Laterality: N/A;    Family History  Problem Relation Age of Onset  . Asthma Mother   . Miscarriages / [place] Mother   . Learning disabilities Sister   . Mental illness Sister   . Mental retardation Sister     Social History   Tobacco Use  . Smoking status: Current Every Day Smoker    Packs/day: 0.25    Types: Cigarettes  . Smokeless tobacco: Never Used  . Tobacco comment: declines  Substance Use Topics  . Alcohol use: Yes    Alcohol/week: 1.2 oz    Types: 2 Glasses of wine per week    Comment: 2 sips per month  . Drug use: Yes    Types: Cocaine    Comment: Last smoved cocain 1 wk ago    Allergies:  Allergies  Allergen Reactions  . Penicillins      Unsure  Has patient had a PCN reaction causing immediate rash, facial/tongue/throat swelling, SOB or lightheadedness with hypotension: Yes Has patient had a PCN reaction causing severe rash involving mucus membranes or skin necrosis: No Has patient had a PCN reaction that required hospitalization: No Has patient had a PCN reaction occurring within the last 10 years: Yes If all of the above answers are "NO", then may proceed with Cephalosporin use.      Medications Prior to Admission  Medication Sig Dispense Refill Last Dose  . calcium carbonate (TUMS - DOSED IN MG ELEMENTAL CALCIUM) 500 MG chewable tablet Chew 1-2 tablets by mouth daily as needed for indigestion or heartburn.   Past Week at Unknown time  . albuterol (PROVENTIL HFA;VENTOLIN HFA) 108 (90 BASE) MCG/ACT inhaler Inhale 2 puffs into the lungs every 6 (six) hours as needed for wheezing. For shortness of breath 1 Inhaler 2 More than one year    Review of Systems  Constitutional: Negative.  Negative for fatigue and fever.  HENT: Negative.   Respiratory: Negative.  Negative for shortness of breath.   Cardiovascular: Negative.  Negative for chest pain.  Gastrointestinal: Positive for abdominal  pain. Negative for constipation, diarrhea, nausea and vomiting.  Genitourinary: Positive for vaginal bleeding. Negative for dysuria.  Neurological: Negative.  Negative for dizziness and headaches.   Physical Exam   There were no vitals taken for this visit.  Physical Exam  Nursing note and vitals reviewed. Constitutional: She is oriented to person, place, and time. She appears well-developed and well-nourished. No distress.  HENT:  Head: Normocephalic.  Eyes: Pupils are equal, round, and reactive to light.  Cardiovascular: Normal rate, regular rhythm and normal heart sounds.  Respiratory: Effort normal and breath sounds normal. No respiratory distress.  GI: Soft. Bowel sounds are normal. She exhibits no distension and no mass. There  is no tenderness. There is no rebound and no guarding.  Neurological: She is alert and oriented to person, place, and time.  Skin: Skin is warm and dry.  Psychiatric: She has a normal mood and affect. Her behavior is normal. Judgment and thought content normal.   Pelvic exam: Cervix pink, visually closed, without lesion, scant white creamy discharge, vaginal walls and external genitalia normal Bimanual exam: Cervix 0/long/high, firm, anterior, neg CMT, uterus nontender, nonenlarged, adnexa without tenderness, enlargement, or mass  MAU Course  Procedures Results for orders placed or performed during the hospital encounter of 09/17/17 (from the past 24 hour(s))  Urinalysis, Routine w reflex microscopic     Status: Abnormal   Collection Time: 09/17/17 12:56 PM  Result Value Ref Range   Color, Urine STRAW (A) YELLOW   APPearance CLEAR CLEAR   Specific Gravity, Urine 1.011 1.005 - 1.030   pH 5.0 5.0 - 8.0   Glucose, UA NEGATIVE NEGATIVE mg/dL   Hgb urine dipstick MODERATE (A) NEGATIVE   Bilirubin Urine NEGATIVE NEGATIVE   Ketones, ur NEGATIVE NEGATIVE mg/dL   Protein, ur NEGATIVE NEGATIVE mg/dL   Nitrite NEGATIVE NEGATIVE   Leukocytes, UA SMALL (A) NEGATIVE   RBC / HPF 0-5 0 - 5 RBC/hpf   WBC, UA 0-5 0 - 5 WBC/hpf   Bacteria, UA RARE (A) NONE SEEN   Squamous Epithelial / LPF 0-5 (A) NONE SEEN   Mucus PRESENT   Urine rapid drug screen (hosp performed)     Status: Abnormal   Collection Time: 09/17/17 12:56 PM  Result Value Ref Range   Opiates NONE DETECTED NONE DETECTED   Cocaine POSITIVE (A) NONE DETECTED   Benzodiazepines NONE DETECTED NONE DETECTED   Amphetamines NONE DETECTED NONE DETECTED   Tetrahydrocannabinol NONE DETECTED NONE DETECTED   Barbiturates NONE DETECTED NONE DETECTED  Pregnancy, urine POC     Status: None   Collection Time: 09/17/17  1:17 PM  Result Value Ref Range   Preg Test, Ur NEGATIVE NEGATIVE  CBC     Status: None   Collection Time: 09/17/17  1:51 PM   Result Value Ref Range   WBC 9.0 4.0 - 10.5 K/uL   RBC 4.20 3.87 - 5.11 MIL/uL   Hemoglobin 13.2 12.0 - 15.0 g/dL   HCT 21.339.6 08.636.0 - 57.846.0 %   MCV 94.3 78.0 - 100.0 fL   MCH 31.4 26.0 - 34.0 pg   MCHC 33.3 30.0 - 36.0 g/dL   RDW 46.914.8 62.911.5 - 52.815.5 %   Platelets 331 150 - 400 K/uL  hCG, serum, qualitative     Status: None   Collection Time: 09/17/17  1:51 PM  Result Value Ref Range   Preg, Serum NEGATIVE NEGATIVE  Wet prep, genital     Status: Abnormal   Collection Time: 09/17/17  1:55 PM  Result Value Ref Range   Yeast Wet Prep HPF POC NONE SEEN NONE SEEN   Trich, Wet Prep NONE SEEN NONE SEEN   Clue Cells Wet Prep HPF POC NONE SEEN NONE SEEN   WBC, Wet Prep HPF POC FEW (A) NONE SEEN   Sperm NONE SEEN    MDM UA, UPT UDS CBC, HCG  Reassurance provided of negative urine and blood pregnancy tests. Low suspicion for appendicitis due to location of pain and absence of fever, leukocytosis and GI complaints.   Assessment and Plan   1. Pregnancy examination or test, negative result   2. Generalized abdominal pain    -Discharge home in stable condition -Abdominal pain precautions discussed -Patient advised to follow-up with GYN of choice for routine care, list given -Patient may return to MAU as needed or if her condition were to change or worsen  Rolm Bookbinder CNM 09/17/2017, 2:48 PM

## 2017-09-19 LAB — GC/CHLAMYDIA PROBE AMP (~~LOC~~) NOT AT ARMC
Chlamydia: NEGATIVE
NEISSERIA GONORRHEA: NEGATIVE

## 2017-11-26 ENCOUNTER — Encounter (HOSPITAL_COMMUNITY): Payer: Self-pay | Admitting: Emergency Medicine

## 2017-11-26 ENCOUNTER — Other Ambulatory Visit: Payer: Self-pay

## 2017-11-26 ENCOUNTER — Emergency Department (HOSPITAL_COMMUNITY): Payer: Self-pay

## 2017-11-26 ENCOUNTER — Emergency Department (HOSPITAL_COMMUNITY)
Admission: EM | Admit: 2017-11-26 | Discharge: 2017-11-26 | Disposition: A | Discharge status: Home / Self Care | Payer: Self-pay | Attending: Emergency Medicine | Admitting: Emergency Medicine

## 2017-11-26 DIAGNOSIS — R1030 Lower abdominal pain, unspecified: Secondary | ICD-10-CM | POA: Insufficient documentation

## 2017-11-26 DIAGNOSIS — F17210 Nicotine dependence, cigarettes, uncomplicated: Secondary | ICD-10-CM | POA: Insufficient documentation

## 2017-11-26 LAB — URINALYSIS, ROUTINE W REFLEX MICROSCOPIC
BILIRUBIN URINE: NEGATIVE
Bacteria, UA: NONE SEEN
GLUCOSE, UA: NEGATIVE mg/dL
Ketones, ur: NEGATIVE mg/dL
Leukocytes, UA: NEGATIVE
NITRITE: NEGATIVE
PROTEIN: NEGATIVE mg/dL
Specific Gravity, Urine: 1.015 (ref 1.005–1.030)
Squamous Epithelial / LPF: NONE SEEN
pH: 6 (ref 5.0–8.0)

## 2017-11-26 LAB — WET PREP, GENITAL
CLUE CELLS WET PREP: NONE SEEN
SPERM: NONE SEEN
TRICH WET PREP: NONE SEEN
YEAST WET PREP: NONE SEEN

## 2017-11-26 LAB — COMPREHENSIVE METABOLIC PANEL
ALK PHOS: 76 U/L (ref 38–126)
ALT: 7 U/L — AB (ref 14–54)
ANION GAP: 7 (ref 5–15)
AST: 16 U/L (ref 15–41)
Albumin: 3.4 g/dL — ABNORMAL LOW (ref 3.5–5.0)
BILIRUBIN TOTAL: 0.4 mg/dL (ref 0.3–1.2)
BUN: 13 mg/dL (ref 6–20)
CALCIUM: 8.8 mg/dL — AB (ref 8.9–10.3)
CO2: 25 mmol/L (ref 22–32)
CREATININE: 0.78 mg/dL (ref 0.44–1.00)
Chloride: 103 mmol/L (ref 101–111)
GFR calc Af Amer: >60 mL/min (ref >60)
GFR calc non Af Amer: >60 mL/min (ref >60)
Glucose, Bld: 95 mg/dL (ref 65–99)
Potassium: 3.5 mmol/L (ref 3.5–5.1)
SODIUM: 135 mmol/L (ref 135–145)
Total Protein: 6.9 g/dL (ref 6.5–8.1)

## 2017-11-26 LAB — CBC
HCT: 40 % (ref 36.0–46.0)
HEMOGLOBIN: 13.8 g/dL (ref 12.0–15.0)
MCH: 32.5 pg (ref 26.0–34.0)
MCHC: 34.5 g/dL (ref 30.0–36.0)
MCV: 94.3 fL (ref 78.0–100.0)
PLATELETS: 372 10*3/uL (ref 150–400)
RBC: 4.24 MIL/uL (ref 3.87–5.11)
RDW: 15.4 % (ref 11.5–15.5)
WBC: 16 10*3/uL — AB (ref 4.0–10.5)

## 2017-11-26 LAB — LIPASE, BLOOD: Lipase: 31 U/L (ref 11–51)

## 2017-11-26 LAB — I-STAT BETA HCG BLOOD, ED (MC, WL, AP ONLY)

## 2017-11-26 MED ORDER — MORPHINE SULFATE (PF) 4 MG/ML IV SOLN
4.0000 mg | Freq: Once | INTRAVENOUS | Status: AC
  Administered 2017-11-26: 4 mg via INTRAVENOUS
  Filled 2017-11-26: qty 1

## 2017-11-26 MED ORDER — IOPAMIDOL (ISOVUE-300) INJECTION 61%
INTRAVENOUS | Status: AC
Start: 2017-11-26 — End: 2017-11-26
  Administered 2017-11-26: 100 mL
  Filled 2017-11-26: qty 100

## 2017-11-26 MED ORDER — DIPHENHYDRAMINE HCL 50 MG/ML IJ SOLN
25.0000 mg | Freq: Once | INTRAMUSCULAR | Status: AC
  Administered 2017-11-26: 25 mg via INTRAVENOUS

## 2017-11-26 MED ORDER — ONDANSETRON HCL 4 MG/2ML IJ SOLN
4.0000 mg | Freq: Once | INTRAMUSCULAR | Status: AC
  Administered 2017-11-26: 4 mg via INTRAVENOUS
  Filled 2017-11-26: qty 2

## 2017-11-26 NOTE — ED Triage Notes (Signed)
PT to ED via [facility].  Reports "spotting" when urinating over the past 3 days with lower abd pain, nausea, and bilateral flank pain.  Reports heavy vaginal bleeding today.  LMP was October.

## 2017-11-26 NOTE — ED Notes (Signed)
Pt returns from radiology. 

## 2017-11-26 NOTE — ED Notes (Signed)
Patient able to ambulate independently  

## 2017-11-26 NOTE — Discharge Instructions (Addendum)
For pain control please take ibuprofen (also known as Motrin or Advil) 800mg (this is normally 4 over the counter pills) 3 times a day  for 5 days. Take with food to minimize stomach irritation. ° °Do not hesitate to return to the emergency room for any new, worsening or concerning symptoms. ° °Please obtain primary care using resource guide below. Let them know that you were seen in the emergency room and that they will need to obtain records for further outpatient management. ° ° ° °

## 2017-11-26 NOTE — ED Notes (Signed)
Iv removed by tech

## 2017-11-26 NOTE — ED Provider Notes (Signed)
[facility] EMERGENCY DEPARTMENT Provider Note   CSN: [id number] Arrival date & time: 11/26/17  0414     History   Chief Complaint Chief Complaint  Patient presents with  . Vaginal Bleeding  . Abdominal Pain  . Flank Pain    HPI   Blood pressure (!) 108/54, pulse 88, temperature 98.1 F (36.7 C), temperature source Oral, resp. rate 18, SpO2 96 %.  Olivia Mcdaniel is a 37 y.o. female prime by EMS complaining of severe bilateral lower abdominal pain worsening course over the course of the last 3 days.  She started spotting (she has not had a period since October 2018) the bleeding became more heavy today.  She states she is going through a tampon every 2-3 hours.  She reports that the pain radiates around to the back, she has no dysuria, hematuria, urinary frequency, nausea vomiting or change in defecation.  No pain medications taken prior to arrival, she rates it 10 out of 10.   Past Medical History:  Diagnosis Date  . Gallstones   . Medical history non-contributory     Patient Active Problem List   Diagnosis Date Noted  . Cholecystitis 06/17/2015    Past Surgical History:  Procedure Laterality Date  . CHOLECYSTECTOMY  06/17/2015  . CHOLECYSTECTOMY N/A 06/17/2015   Procedure: LAPAROSCOPIC CHOLECYSTECTOMY;  Surgeon: Emelia Loron, MD;  Location: [facility] OR;  Service: General;  Laterality: N/A;     OB History    Gravida  2   Para  2   Term  0   Preterm  2   AB      Living  2     SAB      TAB      Ectopic      Multiple      Live Births           Obstetric Comments  SVD at 36 wks. Not in the NICU. Delivered one in [place] and one at [facility] in [place]         Home Medications    Prior to Admission medications   Medication Sig Start Date End Date Taking? Authorizing Provider  albuterol (PROVENTIL HFA;VENTOLIN HFA) 108 (90 BASE) MCG/ACT inhaler Inhale 2 puffs into the lungs every 6 (six) hours as needed for wheezing.  For shortness of breath Patient not taking: Reported on 11/26/2017 10/15/12   Johnnette Gourd, MD    Family History Family History  Problem Relation Age of Onset  . Asthma Mother   . Miscarriages / [place] Mother   . Learning disabilities Sister   . Mental illness Sister   . Mental retardation Sister     Social History Social History   Tobacco Use  . Smoking status: Current Every Day Smoker    Packs/day: 0.25    Types: Cigarettes  . Smokeless tobacco: Never Used  . Tobacco comment: declines  Substance Use Topics  . Alcohol use: Yes    Alcohol/week: 1.2 oz    Types: 2 Glasses of wine per week  . Drug use: Yes    Types: Cocaine     Allergies   Penicillins and Morphine and related   Review of Systems Review of Systems  A complete review of systems was obtained and all systems are negative except as noted in the HPI and PMH.   Physical Exam Updated Vital Signs BP (!) 121/96   Pulse 66   Temp 98.1 F (36.7 C) (Oral)   Resp 18  Ht 5\' 5"  (1.651 m)   Wt 49.9 kg (110 lb)   LMP 11/26/2017   SpO2 100%   BMI 18.30 kg/m   Physical Exam  Constitutional: She is oriented to person, place, and time. She appears well-developed and well-nourished. No distress.  HENT:  Head: Normocephalic and atraumatic.  Mouth/Throat: Oropharynx is clear and moist.  Eyes: Pupils are equal, round, and reactive to light. Conjunctivae and EOM are normal.  Neck: Normal range of motion.  Cardiovascular: Normal rate, regular rhythm and intact distal pulses.  Pulmonary/Chest: Effort normal and breath sounds normal.  Abdominal: Soft. There is tenderness.  No tenderness to palpation of any quadrant, no CVA tenderness to percussion  Genitourinary:  Genitourinary Comments: GU exam is chaperoned by nurse: No rashes or lesions, no cervical motion or adnexal tenderness. No bleeding.  Musculoskeletal: Normal range of motion.  Neurological: She is alert and oriented to person, place, and time. Cranial  nerve deficit: dm.  Skin: She is not diaphoretic. No pallor.  Psychiatric: She has a normal mood and affect.  Nursing note and vitals reviewed.    ED Treatments / Results  Labs (all labs ordered are listed, but only abnormal results are displayed) Labs Reviewed  WET PREP, GENITAL - Abnormal; Notable for the following components:      Result Value   WBC, Wet Prep HPF POC FEW (*)    All other components within normal limits  COMPREHENSIVE METABOLIC PANEL - Abnormal; Notable for the following components:   Calcium 8.8 (*)    Albumin 3.4 (*)    ALT 7 (*)    All other components within normal limits  CBC - Abnormal; Notable for the following components:   WBC 16.0 (*)    All other components within normal limits  URINALYSIS, ROUTINE W REFLEX MICROSCOPIC - Abnormal; Notable for the following components:   Hgb urine dipstick SMALL (*)    All other components within normal limits  URINE CULTURE  LIPASE, BLOOD  RPR  HIV ANTIBODY (ROUTINE TESTING)  I-STAT BETA HCG BLOOD, ED ([facility], [facility], [facility] ONLY)  GC/CHLAMYDIA PROBE AMP ([place]) NOT AT [facility]    EKG None  Radiology Kore[place] Transvaginal Non-ob  Result Date: 11/26/2017 CLINICAL DATA:  RIGHT lower quadrant abdominal pain since last night, vaginal bleeding for 2 days EXAM: TRANSABDOMINAL AND TRANSVAGINAL ULTRASOUND OF PELVIS DOPPLER ULTRASOUND OF OVARIES TECHNIQUE: Both transabdominal and transvaginal ultrasound examinations of the pelvis were performed. Transabdominal technique was performed for global imaging of the pelvis including uterus, ovaries, adnexal regions, and pelvic cul-de-sac. It was necessary to proceed with endovaginal exam following the transabdominal exam to visualize the uterus and RIGHT ovary. Color and duplex Doppler ultrasound was utilized to evaluate blood flow to the ovaries. COMPARISON:  None FINDINGS: Uterus Measurements: 8.5 x 3.8 x 5.1 cm. Normal morphology. Question small vague anterior mid uterine leiomyoma,  submucosal, 18 x 13 x 14 mm Endometrium Thickness: 6 mm thick. Heterogeneous appearance. Small amount of endometrial fluid. Right ovary Measurements: 2.2 x 1.1 x 1.6 cm. Normal morphology without mass. Internal blood flow present on color Doppler imaging. Left ovary Measurements: 3.9 x 3.1 x 3.8 cm. Small simple appearing cyst 3.1 x 2.0 x 2.6 cm. No additional masses. Internal blood flow present on color Doppler imaging. Pulsed Doppler evaluation of both ovaries demonstrates normal low-resistance arterial and venous waveforms. Other findings No free pelvic fluid IMPRESSION: Small LEFT ovarian cyst 3.1 cm diameter. Small amount of endometrial fluid with mild heterogeneity of endometrium, nonspecific.  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 No evidence of ovarian torsion. Electronically Signed   By: Ulyses Southward M.D.   On: 11/26/2017 09:52   US Pelvis Complete  Result Date: 11/26/2017 CLINICAL DATA:  RIGHT lower quadrant abdominal pain since last night, vaginal bleeding for 2 days EXAM: TRANSABDOMINAL AND TRANSVAGINAL ULTRASOUND OF PELVIS DOPPLER ULTRASOUND OF OVARIES TECHNIQUE: Both transabdominal and transvaginal ultrasound examinations of the pelvis were performed. Transabdominal technique was performed for global imaging of the pelvis including uterus, ovaries, adnexal regions, and pelvic cul-de-sac. It was necessary to proceed with endovaginal exam following the transabdominal exam to visualize the uterus and RIGHT ovary. Color and duplex Doppler ultrasound was utilized to evaluate blood flow to the ovaries. COMPARISON:  None FINDINGS: Uterus Measurements: 8.5 x 3.8 x 5.1 cm. Normal morphology. Question small vague anterior mid uterine leiomyoma, submucosal, 18 x 13 x 14 mm Endometrium Thickness: 6 mm thick. Heterogeneous appearance. Small amount of endometrial fluid. Right ovary Measurements: 2.2 x 1.1 x 1.6 cm. Normal morphology without mass. Internal blood flow present  on color Doppler imaging. Left ovary Measurements: 3.9 x 3.1 x 3.8 cm. Small simple appearing cyst 3.1 x 2.0 x 2.6 cm. No additional masses. Internal blood flow present on color Doppler imaging. Pulsed Doppler evaluation of both ovaries demonstrates normal low-resistance arterial and venous waveforms. Other findings No free pelvic fluid IMPRESSION: Small LEFT ovarian cyst 3.1 cm diameter. Small amount of endometrial fluid with mild heterogeneity of endometrium, nonspecific. 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 No evidence of ovarian torsion. Electronically Signed   By: Ulyses Southward M.D.   On: 11/26/2017 09:52   Ct Abdomen Pelvis W Contrast  Result Date: 11/26/2017 CLINICAL DATA:  Heavy vaginal bleeding for the past 2 days. EXAM: CT ABDOMEN AND PELVIS WITH CONTRAST TECHNIQUE: Multidetector CT imaging of the abdomen and pelvis was performed using the standard protocol following bolus administration of intravenous contrast. CONTRAST:  ISOVUE-300 IOPAMIDOL (ISOVUE-300) INJECTION 61% COMPARISON:  Pelvic ultrasound from same day. FINDINGS: Lower chest: No acute abnormality. Hepatobiliary: There are a few subcentimeter low-density lesions within the liver which are too small to characterize, but statistically likely to be cysts. Prior cholecystectomy. Slight prominence of the common bile duct is likely due to post cholecystectomy state. Pancreas: Unremarkable. No pancreatic ductal dilatation or surrounding inflammatory changes. Spleen: Normal in size without focal abnormality. Adrenals/Urinary Tract: The adrenal glands are unremarkable. Subcentimeter low-density lesions within the bilateral kidneys are too small to characterize, but statistically likely to be simple cysts. No renal or ureteral calculi. No hydronephrosis. The bladder is unremarkable. Stomach/Bowel: Stomach is within normal limits. Appendix appears normal. No evidence of bowel wall thickening, distention, or  inflammatory changes. Vascular/Lymphatic: Multiple prominent, dilated vessels in the pelvis around the uterus. Normal caliber abdominal aorta. No lymphadenopathy. Reproductive: The uterus and right adnexa are unremarkable. Simple appearing 3.1 cm cyst in the left ovary, better evaluated on pelvic ultrasound from same day. Nabothian cyst in the cervix. Other: Tiny fat containing umbilical hernia. No pneumoperitoneum or free fluid. Musculoskeletal: No acute or significant osseous findings. IMPRESSION: 1.  No acute intra-abdominal process. 2. Prominent pelvic vasculature surrounding the uterus. Correlate for pelvic congestion syndrome. Electronically Signed   By: Obie Dredge M.D.   On: 11/26/2017 12:27   [place] Art/ven Flow Abd Pelv Doppler  Result Date: 11/26/2017 CLINICAL DATA:  RIGHT lower quadrant abdominal pain since last night, vaginal bleeding for 2 days EXAM:  TRANSABDOMINAL AND TRANSVAGINAL ULTRASOUND OF PELVIS DOPPLER ULTRASOUND OF OVARIES TECHNIQUE: Both transabdominal and transvaginal ultrasound examinations of the pelvis were performed. Transabdominal technique was performed for global imaging of the pelvis including uterus, ovaries, adnexal regions, and pelvic cul-de-sac. It was necessary to proceed with endovaginal exam following the transabdominal exam to visualize the uterus and RIGHT ovary. Color and duplex Doppler ultrasound was utilized to evaluate blood flow to the ovaries. COMPARISON:  None FINDINGS: Uterus Measurements: 8.5 x 3.8 x 5.1 cm. Normal morphology. Question small vague anterior mid uterine leiomyoma, submucosal, 18 x 13 x 14 mm Endometrium Thickness: 6 mm thick. Heterogeneous appearance. Small amount of endometrial fluid. Right ovary Measurements: 2.2 x 1.1 x 1.6 cm. Normal morphology without mass. Internal blood flow present on color Doppler imaging. Left ovary Measurements: 3.9 x 3.1 x 3.8 cm. Small simple appearing cyst 3.1 x 2.0 x 2.6 cm. No additional masses. Internal blood flow  present on color Doppler imaging. Pulsed Doppler evaluation of both ovaries demonstrates normal low-resistance arterial and venous waveforms. Other findings No free pelvic fluid IMPRESSION: Small LEFT ovarian cyst 3.1 cm diameter. Small amount of endometrial fluid with mild heterogeneity of endometrium, nonspecific. 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 No evidence of ovarian torsion. Electronically Signed   By: Ulyses Southward M.D.   On: 11/26/2017 09:52    Procedures Procedures (including critical care time)  Medications Ordered in ED Medications  morphine 4 MG/ML injection 4 mg (4 mg Intravenous Given 11/26/17 0643)  ondansetron (ZOFRAN) injection 4 mg (4 mg Intravenous Given 11/26/17 0643)  iopamidol (ISOVUE-300) 61 % injection (100 mLs  Contrast Given 11/26/17 1201)  diphenhydrAMINE (BENADRYL) injection 25 mg (25 mg Intravenous Given 11/26/17 0653)     Initial Impression / Assessment and Plan / ED Course  I have reviewed the triage vital signs and the nursing notes.  Pertinent labs & imaging results that were available during my care of the patient were reviewed by me and considered in my medical decision making (see chart for details).     Vitals:   11/26/17 0645 11/26/17 0815 11/26/17 1038 11/26/17 1129  BP: 105/80   (!) 121/96  Pulse: 69  62 66  Resp: 18  18 18   Temp:      TempSrc:      SpO2: 97%  99% 100%  Weight:  49.9 kg (110 lb)    Height:  5\' 5"  (1.651 m)      Medications  morphine 4 MG/ML injection 4 mg (4 mg Intravenous Given 11/26/17 0643)  ondansetron (ZOFRAN) injection 4 mg (4 mg Intravenous Given 11/26/17 0643)  iopamidol (ISOVUE-300) 61 % injection (100 mLs  Contrast Given 11/26/17 1201)  diphenhydrAMINE (BENADRYL) injection 25 mg (25 mg Intravenous Given 11/26/17 0653)    Olivia Mcdaniel is 37 y.o. female presenting with her abdominal pain radiating to the back worsening over the course of 3 days when she started her period,  of note this patient has not had a period in 5 months.  This is likely menstrual cramping.  However given her elevated white count will perform pelvic and obtain CT.  Informed by RN that patient has had hives localized at the IV site after morphine was administered.  No wheezing, shortness of breath, angioedema nausea or vomiting.  Patient given Benadryl with good relief in her symptoms.  Patient evaluated at the bedside, lung sounds clear, no angioedema.  Hives have resolved.  Wet prep with no significant abnormality.  Advised the patient's fianc was here he was being belligerent, police had to escort him out.  Patient states that she has a safe place to stay and declines social work discussion or consult.   Pelvic ultrasound with no signs of torsion.  She has a small left-sided ovarian cyst.  CT without significant abnormality, she has prominent pelvic vasculature.  Referral given to GYN.  Evaluation does not show pathology that would require ongoing emergent intervention or inpatient treatment. Pt is hemodynamically stable and mentating appropriately. Discussed findings and plan with patient/guardian, who agrees with care plan. All questions answered. Return precautions discussed and outpatient follow up given.      Final Clinical Impressions(s) / ED Diagnoses   Final diagnoses:  Lower abdominal pain    ED Discharge Orders    None       Lynetta Mare Mardella Layman 11/26/17 1248    Tilden Fossa, MD 11/27/17 [phone]

## 2017-11-26 NOTE — ED Notes (Signed)
Patient transported to Ultrasound 

## 2017-11-27 LAB — HIV ANTIBODY (ROUTINE TESTING W REFLEX): HIV Screen 4th Generation wRfx: NONREACTIVE

## 2017-11-27 LAB — RPR: RPR: NONREACTIVE

## 2017-11-28 LAB — URINE CULTURE

## 2017-11-28 LAB — GC/CHLAMYDIA PROBE AMP (~~LOC~~) NOT AT ARMC
Chlamydia: NEGATIVE
NEISSERIA GONORRHEA: NEGATIVE

## 2017-11-29 ENCOUNTER — Encounter (HOSPITAL_COMMUNITY): Payer: Self-pay

## 2017-11-29 ENCOUNTER — Inpatient Hospital Stay (HOSPITAL_COMMUNITY)
Admission: AD | Admit: 2017-11-29 | Discharge: 2017-11-29 | Disposition: A | Discharge status: Home / Self Care | Payer: Self-pay | Source: Ambulatory Visit | Attending: Family Medicine | Admitting: Family Medicine

## 2017-11-29 DIAGNOSIS — Z885 Allergy status to narcotic agent status: Secondary | ICD-10-CM | POA: Insufficient documentation

## 2017-11-29 DIAGNOSIS — F17210 Nicotine dependence, cigarettes, uncomplicated: Secondary | ICD-10-CM | POA: Insufficient documentation

## 2017-11-29 DIAGNOSIS — N83202 Unspecified ovarian cyst, left side: Secondary | ICD-10-CM | POA: Insufficient documentation

## 2017-11-29 DIAGNOSIS — Z880 Allergy status to penicillin: Secondary | ICD-10-CM | POA: Insufficient documentation

## 2017-11-29 DIAGNOSIS — R1084 Generalized abdominal pain: Secondary | ICD-10-CM | POA: Insufficient documentation

## 2017-11-29 LAB — CBC
HEMATOCRIT: 39.4 % (ref 36.0–46.0)
Hemoglobin: 13.4 g/dL (ref 12.0–15.0)
MCH: 31.4 pg (ref 26.0–34.0)
MCHC: 34 g/dL (ref 30.0–36.0)
MCV: 92.3 fL (ref 78.0–100.0)
Platelets: 325 10*3/uL (ref 150–400)
RBC: 4.27 MIL/uL (ref 3.87–5.11)
RDW: 15.2 % (ref 11.5–15.5)
WBC: 10.2 10*3/uL (ref 4.0–10.5)

## 2017-11-29 LAB — URINALYSIS, ROUTINE W REFLEX MICROSCOPIC
BILIRUBIN URINE: NEGATIVE
GLUCOSE, UA: NEGATIVE mg/dL
Ketones, ur: NEGATIVE mg/dL
NITRITE: NEGATIVE
PH: 5 (ref 5.0–8.0)
Protein, ur: NEGATIVE mg/dL
SPECIFIC GRAVITY, URINE: 1.017 (ref 1.005–1.030)

## 2017-11-29 LAB — COMPREHENSIVE METABOLIC PANEL
ALK PHOS: 72 U/L (ref 38–126)
ALT: 9 U/L — ABNORMAL LOW (ref 14–54)
ANION GAP: 8 (ref 5–15)
AST: 17 U/L (ref 15–41)
Albumin: 3.7 g/dL (ref 3.5–5.0)
BILIRUBIN TOTAL: 0.3 mg/dL (ref 0.3–1.2)
BUN: 12 mg/dL (ref 6–20)
CALCIUM: 8.6 mg/dL — AB (ref 8.9–10.3)
CO2: 21 mmol/L — ABNORMAL LOW (ref 22–32)
CREATININE: 0.86 mg/dL (ref 0.44–1.00)
Chloride: 105 mmol/L (ref 101–111)
Glucose, Bld: 130 mg/dL — ABNORMAL HIGH (ref 65–99)
Potassium: 3.8 mmol/L (ref 3.5–5.1)
Sodium: 134 mmol/L — ABNORMAL LOW (ref 135–145)
TOTAL PROTEIN: 7.1 g/dL (ref 6.5–8.1)

## 2017-11-29 LAB — POCT PREGNANCY, URINE: Preg Test, Ur: NEGATIVE

## 2017-11-29 MED ORDER — KETOROLAC TROMETHAMINE 60 MG/2ML IM SOLN
60.0000 mg | Freq: Once | INTRAMUSCULAR | Status: AC
  Administered 2017-11-29: 60 mg via INTRAMUSCULAR
  Filled 2017-11-29: qty 2

## 2017-11-29 MED ORDER — OXYCODONE-ACETAMINOPHEN 5-325 MG PO TABS: 1.0000 | ORAL_TABLET | Freq: Three times a day (TID) | ORAL | 0 refills | Status: DC

## 2017-11-29 NOTE — MAU Provider Note (Signed)
History     CSN: [id number]  Arrival date and time: 11/29/17 1654   First Provider Initiated Contact with Patient 11/29/17 2224     Chief Complaint  Patient presents with  . Abdominal Pain  . Back Pain   HPI Olivia Mcdaniel is a 37 y.o. G2P0202 non pregnant female who presents with abdominal and back pain. She states this is an ongoing issue and is no different than it was 3 days ago when she was seen in the [facility] ED. She had a pelvic ultrasound and CT scan that showed a 3cm left ovarian cyst and possible pelvic congestion syndrome. She states she called the office to schedule her follow up and was upset because she was never told she had a cyst. She states the ER did not give her anything for pain at home and she is upset about that as well. She currently rates her pain a 5/10 and has not taken anything for the pain. She denies any vaginal discharge or bleeding.   OB History    Gravida  2   Para  2   Term  0   Preterm  2   AB      Living  2     SAB      TAB      Ectopic      Multiple      Live Births           Obstetric Comments  SVD at 36 wks. Not in the NICU. Delivered one in [place] and one at [facility] in [place]        Past Medical History:  Diagnosis Date  . Gallstones   . Medical history non-contributory     Past Surgical History:  Procedure Laterality Date  . CHOLECYSTECTOMY  06/17/2015  . CHOLECYSTECTOMY N/A 06/17/2015   Procedure: LAPAROSCOPIC CHOLECYSTECTOMY;  Surgeon: Emelia Loron, MD;  Location: [facility] OR;  Service: General;  Laterality: N/A;    Family History  Problem Relation Age of Onset  . Asthma Mother   . Miscarriages / [place] Mother   . Learning disabilities Sister   . Mental illness Sister   . Mental retardation Sister     Social History   Tobacco Use  . Smoking status: Current Every Day Smoker    Packs/day: 0.25    Types: Cigarettes  . Smokeless tobacco: Never Used  . Tobacco comment: declines   Substance Use Topics  . Alcohol use: Yes    Alcohol/week: 1.2 oz    Types: 2 Glasses of wine per week  . Drug use: Yes    Types: Cocaine    Allergies:  Allergies  Allergen Reactions  . Penicillins     Unsure  Has patient had a PCN reaction causing immediate rash, facial/tongue/throat swelling, SOB or lightheadedness with hypotension: Yes Has patient had a PCN reaction causing severe rash involving mucus membranes or skin necrosis: No Has patient had a PCN reaction that required hospitalization: No Has patient had a PCN reaction occurring within the last 10 years: Yes If all of the above answers are "NO", then may proceed with Cephalosporin use.    Marland Kitchen Morphine And Related Rash    Medications Prior to Admission  Medication Sig Dispense Refill Last Dose  . albuterol (PROVENTIL HFA;VENTOLIN HFA) 108 (90 BASE) MCG/ACT inhaler Inhale 2 puffs into the lungs every 6 (six) hours as needed for wheezing. For shortness of breath (Patient not taking: Reported on 11/26/2017) 1 Inhaler 2 Not  Taking at Unknown time    Review of Systems  Constitutional: Negative.  Negative for fatigue and fever.  HENT: Negative.   Respiratory: Negative.  Negative for shortness of breath.   Cardiovascular: Negative.  Negative for chest pain.  Gastrointestinal: Positive for abdominal pain. Negative for constipation, diarrhea, nausea and vomiting.  Genitourinary: Negative.  Negative for dysuria.  Musculoskeletal: Positive for back pain.  Neurological: Negative.  Negative for dizziness and headaches.   Physical Exam   Blood pressure (!) 86/51, pulse 80, temperature 98.7 F (37.1 C), temperature source Oral, resp. rate 17, height 5\' 3"  (1.6 m), weight 122 lb (55.3 kg), last menstrual period 11/25/2017.  Physical Exam  Nursing note and vitals reviewed. Constitutional: She is oriented to person, place, and time. She appears well-developed and well-nourished. No distress.  HENT:  Head: Normocephalic.  Eyes:  Pupils are equal, round, and reactive to light.  Cardiovascular: Normal rate, regular rhythm and normal heart sounds.  Respiratory: Effort normal and breath sounds normal. No respiratory distress.  GI: Soft. Bowel sounds are normal. She exhibits no distension. There is no tenderness.  Neurological: She is alert and oriented to person, place, and time.  Skin: Skin is warm and dry.  Psychiatric: She has a normal mood and affect. Her behavior is normal. Judgment and thought content normal.    MAU Course  Procedures Results for orders placed or performed during the hospital encounter of 11/29/17 (from the past 24 hour(s))  Urinalysis, Routine w reflex microscopic     Status: Abnormal   Collection Time: 11/29/17  5:28 PM  Result Value Ref Range   Color, Urine YELLOW YELLOW   APPearance HAZY (A) CLEAR   Specific Gravity, Urine 1.017 1.005 - 1.030   pH 5.0 5.0 - 8.0   Glucose, UA NEGATIVE NEGATIVE mg/dL   Hgb urine dipstick MODERATE (A) NEGATIVE   Bilirubin Urine NEGATIVE NEGATIVE   Ketones, ur NEGATIVE NEGATIVE mg/dL   Protein, ur NEGATIVE NEGATIVE mg/dL   Nitrite NEGATIVE NEGATIVE   Leukocytes, UA MODERATE (A) NEGATIVE   RBC / HPF 0-5 0 - 5 RBC/hpf   WBC, UA 6-30 0 - 5 WBC/hpf   Bacteria, UA RARE (A) NONE SEEN   Squamous Epithelial / LPF 6-30 (A) NONE SEEN   Mucus PRESENT   Pregnancy, urine POC     Status: None   Collection Time: 11/29/17  5:43 PM  Result Value Ref Range   Preg Test, Ur NEGATIVE NEGATIVE  CBC     Status: None   Collection Time: 11/29/17  5:55 PM  Result Value Ref Range   WBC 10.2 4.0 - 10.5 K/uL   RBC 4.27 3.87 - 5.11 MIL/uL   Hemoglobin 13.4 12.0 - 15.0 g/dL   HCT 09.839.4 11.936.0 - 14.746.0 %   MCV 92.3 78.0 - 100.0 fL   MCH 31.4 26.0 - 34.0 pg   MCHC 34.0 30.0 - 36.0 g/dL   RDW 82.915.2 56.211.5 - 13.015.5 %   Platelets 325 150 - 400 K/uL  Comprehensive metabolic panel     Status: Abnormal   Collection Time: 11/29/17  5:55 PM  Result Value Ref Range   Sodium 134 (L) 135 -  145 mmol/L   Potassium 3.8 3.5 - 5.1 mmol/L   Chloride 105 101 - 111 mmol/L   CO2 21 (L) 22 - 32 mmol/L   Glucose, Bld 130 (H) 65 - 99 mg/dL   BUN 12 6 - 20 mg/dL   Creatinine, Ser 8.650.86 0.44 - 1.00  mg/dL   Calcium 8.6 (L) 8.9 - 10.3 mg/dL   Total Protein 7.1 6.5 - 8.1 g/dL   Albumin 3.7 3.5 - 5.0 g/dL   AST 17 15 - 41 U/L   ALT 9 (L) 14 - 54 U/L   Alkaline Phosphatase 72 38 - 126 U/L   Total Bilirubin 0.3 0.3 - 1.2 mg/dL   GFR calc non Af Amer >60 >60 mL/min   GFR calc Af Amer >60 >60 mL/min   Anion gap 8 5 - 15   MDM UA, UPT CBC, CMP Toradol 60mg  IM- patient reports relief from pain  Assessment and Plan   1. Generalized abdominal pain   2. Cyst of left ovary    -Discharge home in stable condition -Rx for limited number of percocet for pain relief. Encouraged patient to use 800mg  ibuprofen and percocet for worse pain -Patient advised to follow-up with [facility] as scheduled on April 26th -Patient may return to MAU as needed or if her condition were to change or worsen  Rolm Bookbinder CNM 11/29/2017, 10:52 PM

## 2017-11-29 NOTE — Discharge Instructions (Signed)
Ovarian Cyst An ovarian cyst is a fluid-filled sac that forms on an ovary. The ovaries are small organs that produce eggs in women. Various types of cysts can form on the ovaries. Some may cause symptoms and require treatment. Most ovarian cysts go away on their own, are not cancerous (are benign), and do not cause problems. Common types of ovarian cysts include:  Functional (follicle) cysts. ? Occur during the menstrual cycle, and usually go away with the next menstrual cycle if you do not get pregnant. ? Usually cause no symptoms.  Endometriomas. ? Are cysts that form from the tissue that lines the uterus (endometrium). ? Are sometimes called "chocolate cysts" because they become filled with blood that turns brown. ? Can cause pain in the lower abdomen during intercourse and during your period.  Cystadenoma cysts. ? Develop from cells on the outside surface of the ovary. ? Can get very large and cause lower abdomen pain and pain with intercourse. ? Can cause severe pain if they twist or break open (rupture).  Dermoid cysts. ? Are sometimes found in both ovaries. ? May contain different kinds of body tissue, such as skin, teeth, hair, or cartilage. ? Usually do not cause symptoms unless they get very big.  Theca lutein cysts. ? Occur when too much of a certain hormone (human chorionic gonadotropin) is produced and overstimulates the ovaries to produce an egg. ? Are most common after having procedures used to assist with the conception of a baby (in vitro fertilization).  What are the causes? Ovarian cysts may be caused by:  Ovarian hyperstimulation syndrome. This is a condition that can develop from taking fertility medicines. It causes multiple large ovarian cysts to form.  Polycystic ovarian syndrome (PCOS). This is a common hormonal disorder that can cause ovarian cysts, as well as problems with your period or fertility.  What increases the risk? The following factors may make  you more likely to develop ovarian cysts:  Being overweight or obese.  Taking fertility medicines.  Taking certain forms of hormonal birth control.  Smoking.  What are the signs or symptoms? Many ovarian cysts do not cause symptoms. If symptoms are present, they may include:  Pelvic pain or pressure.  Pain in the lower abdomen.  Pain during sex.  Abdominal swelling.  Abnormal menstrual periods.  Increasing pain with menstrual periods.  How is this diagnosed? These cysts are commonly found during a routine pelvic exam. You may have tests to find out more about the cyst, such as:  Ultrasound.  X-ray of the pelvis.  CT scan.  MRI.  Blood tests.  How is this treated? Many ovarian cysts go away on their own without treatment. Your health care provider may want to check your cyst regularly for 2-3 months to see if it changes. If you are in menopause, it is especially important to have your cyst monitored closely because menopausal women have a higher rate of ovarian cancer. When treatment is needed, it may include:  Medicines to help relieve pain.  A procedure to drain the cyst (aspiration).  Surgery to remove the whole cyst.  Hormone treatment or birth control pills. These methods are sometimes used to help dissolve a cyst.  Follow these instructions at home:  Take over-the-counter and prescription medicines only as told by your health care provider.  Do not drive or use heavy machinery while taking prescription pain medicine.  Get regular pelvic exams and Pap tests as often as told by your health care   provider.  Return to your normal activities as told by your health care provider. Ask your health care provider what activities are safe for you.  Do not use any products that contain nicotine or tobacco, such as cigarettes and e-cigarettes. If you need help quitting, ask your health care provider.  Keep all follow-up visits as told by your health care provider.  This is important. Contact a health care provider if:  Your periods are late, irregular, or painful, or they stop.  You have pelvic pain that does not go away.  You have pressure on your bladder or trouble emptying your bladder completely.  You have pain during sex.  You have any of the following in your abdomen: ? A feeling of fullness. ? Pressure. ? Discomfort. ? Pain that does not go away. ? Swelling.  You feel generally ill.  You become constipated.  You lose your appetite.  You develop severe acne.  You start to have more body hair and facial hair.  You are gaining weight or losing weight without changing your exercise and eating habits.  You think you may be pregnant. Get help right away if:  You have abdominal pain that is severe or gets worse.  You cannot eat or drink without vomiting.  You suddenly develop a fever.  Your menstrual period is much heavier than usual. This information is not intended to replace advice given to you by your health care provider. Make sure you discuss any questions you have with your health care provider. Document Released: 08/23/2005 Document Revised: 03/12/2016 Document Reviewed: 01/25/2016 Elsevier Interactive Patient Education  2018 Elsevier Inc.  

## 2017-11-29 NOTE — Progress Notes (Signed)
ED Antimicrobial Stewardship Positive Culture Follow Up   Olivia Mcdaniel is an 37 y.o. female who presented to [facility] on 11/26/2017 with a chief complaint of  Chief Complaint  Patient presents with  . Vaginal Bleeding  . Abdominal Pain  . Flank Pain    Recent Results (from the past 720 hour(s))  Urine culture     Status: Abnormal   Collection Time: 11/26/17  6:30 AM  Result Value Ref Range Status   Specimen Description URINE, CLEAN CATCH  Final   Special Requests   Final    NONE Performed at [facility], [address] [place], [place] [address]    Culture 10,000 COLONIES/mL ENTEROBACTER SPECIES (A)  Final   Report Status 11/28/2017 FINAL  Final   Organism ID, Bacteria ENTEROBACTER SPECIES (A)  Final      Susceptibility   Enterobacter species - MIC*    CEFAZOLIN RESISTANT Resistant     CEFTRIAXONE <=1 SENSITIVE Sensitive     CIPROFLOXACIN <=0.25 SENSITIVE Sensitive     GENTAMICIN <=1 SENSITIVE Sensitive     IMIPENEM <=0.25 SENSITIVE Sensitive     NITROFURANTOIN <=16 SENSITIVE Sensitive     TRIMETH/SULFA <=20 SENSITIVE Sensitive     PIP/TAZO <=4 SENSITIVE Sensitive     * 10,000 COLONIES/mL ENTEROBACTER SPECIES  Wet prep, genital     Status: Abnormal   Collection Time: 11/26/17  8:24 AM  Result Value Ref Range Status   Yeast Wet Prep HPF POC NONE SEEN NONE SEEN Final   Trich, Wet Prep NONE SEEN NONE SEEN Final   Clue Cells Wet Prep HPF POC NONE SEEN NONE SEEN Final   WBC, Wet Prep HPF POC FEW (A) NONE SEEN Final   Sperm NONE SEEN  Final    Comment: Performed at [facility], [address] [place], [place] [address]    Urinalysis and urine micro negative. No urinary symptoms reported. Colony count is below threshold for treatment. This represents asymptomatic bacteruria and no antibiotics are indicated.   Sallee Provencalurner, Shambhavi Salley S 11/29/2017, 12:33 PM Infectious Diseases Pharmacist Phone# [phone]

## 2017-11-29 NOTE — MAU Note (Signed)
Pt came in by EMS, C/O  RLQ & back pain.  Was seen @ [facility] two days ago, was sent home.  Was told to F/U in clinic, called today, was told she has ovarian cyst & to come to MAU.  Has had vaginal bleeding for the past 4 days, denies bleeding now.  Has been vomiting for the past 4 days, diarrhea for the past 1-2 days.  One diarrhea stool this morning, also vomited once.

## 2017-12-01 LAB — URINE CULTURE

## 2017-12-05 ENCOUNTER — Encounter (HOSPITAL_COMMUNITY): Payer: Self-pay | Admitting: Emergency Medicine

## 2017-12-05 ENCOUNTER — Emergency Department (HOSPITAL_COMMUNITY)
Admission: EM | Admit: 2017-12-05 | Discharge: 2017-12-05 | Disposition: A | Discharge status: Home / Self Care | Payer: Self-pay | Attending: Emergency Medicine | Admitting: Emergency Medicine

## 2017-12-05 DIAGNOSIS — Z5321 Procedure and treatment not carried out due to patient leaving prior to being seen by health care provider: Secondary | ICD-10-CM | POA: Insufficient documentation

## 2017-12-05 DIAGNOSIS — N939 Abnormal uterine and vaginal bleeding, unspecified: Secondary | ICD-10-CM | POA: Insufficient documentation

## 2017-12-05 NOTE — ED Notes (Signed)
Patient called for room placement x1 with no answer. 

## 2017-12-05 NOTE — ED Notes (Signed)
Patient called for room placement x2 with no answer. 

## 2017-12-05 NOTE — ED Notes (Signed)
Patient called x3 for room placement with no answer. 

## 2017-12-05 NOTE — ED Triage Notes (Addendum)
Patient reports hx ovarian cyst and reports vaginal bleeding with blood clots yesterday. [facility] for [facility]. Reports scheduled appointment in April at [facility].

## 2017-12-28 ENCOUNTER — Ambulatory Visit (INDEPENDENT_AMBULATORY_CARE_PROVIDER_SITE_OTHER): Payer: Self-pay | Admitting: Obstetrics & Gynecology

## 2017-12-28 ENCOUNTER — Encounter: Payer: Self-pay | Admitting: Obstetrics & Gynecology

## 2017-12-28 VITALS — BP 107/67 | HR 94 | Wt 119.4 lb

## 2017-12-28 DIAGNOSIS — N83202 Unspecified ovarian cyst, left side: Secondary | ICD-10-CM

## 2017-12-28 DIAGNOSIS — N926 Irregular menstruation, unspecified: Secondary | ICD-10-CM

## 2017-12-28 DIAGNOSIS — R102 Pelvic and perineal pain: Secondary | ICD-10-CM

## 2017-12-28 LAB — POCT PREGNANCY, URINE: PREG TEST UR: NEGATIVE

## 2017-12-28 MED ORDER — IBUPROFEN 800 MG PO TABS: 800.0000 mg | ORAL_TABLET | Freq: Three times a day (TID) | ORAL | 1 refills | Status: DC | PRN

## 2017-12-28 MED ORDER — NORGESTREL-ETHINYL ESTRADIOL 0.3-30 MG-MCG PO TABS: 1.0000 | ORAL_TABLET | Freq: Every day | ORAL | 11 refills | Status: DC

## 2017-12-28 NOTE — Progress Notes (Signed)
Patient ID: Olivia Mcdaniel, female   DOB: Mar 13, 1981, 37 y.o.   MRN: [id number]  Chief Complaint  Patient presents with  . Ovarian Cyst    HPI Olivia ElkChristina M Pemble is a 37 y.o. female who is here today with the concerns of an ovarian cyst that was discovered in the ED that presented with the right sided abdominal pain and vaginal bleeding. The bleeding was heavier than normal "I was bleeding like a stuff pig". Today the right sided abdominal pain is located in the upper right quadrant and is described as sharp constant pain that radiated to her back. The pain has continually increased since her ED visit. The pain increases when laying down and feels better when standing up. Denies alcohol use, vaginal bleed or discharge, hx of STIs, no new sexual partners.   Admits occasional cocaine use,constipation last BM last night.   Hx: of Cholecystectomy for gallstones.   She also has a recent history of irregular periods. Her last period was October and thought she was pregnant at the above visit for the ovarian cyst. They have been irregular for the last 2 years. She has only been seen in the emergency dept for the above complaints.     HPI  Past Medical History:  Diagnosis Date  . Gallstones   . Medical history non-contributory     Past Surgical History:  Procedure Laterality Date  . CHOLECYSTECTOMY  06/17/2015  . CHOLECYSTECTOMY N/A 06/17/2015   Procedure: LAPAROSCOPIC CHOLECYSTECTOMY;  Surgeon: Emelia LoronMatthew Wakefield, MD;  [facility] OR;  Service: General;  Laterality: N/A;    Family History  Problem Relation Age of Onset  . Asthma Mother   . Miscarriages / [place] Mother   . Learning disabilities Sister   . Mental illness Sister   . Mental retardation Sister     Social History Social History   Tobacco Use  . Smoking status: Current Every Day Smoker    Packs/day: 0.25    Types: Cigarettes  . Smokeless tobacco: Never Used  . Tobacco comment: declines  Substance Use Topics   . Alcohol use: Yes    Alcohol/week: 1.2 oz    Types: 2 Glasses of wine per week  . Drug use: Yes    Types: Cocaine    Allergies  Allergen Reactions  . Penicillins     Unsure  Has patient had a PCN reaction causing immediate rash, facial/tongue/throat swelling, SOB or lightheadedness with hypotension: Yes Has patient had a PCN reaction causing severe rash involving mucus membranes or skin necrosis: No Has patient had a PCN reaction that required hospitalization: No Has patient had a PCN reaction occurring within the last 10 years: Yes If all of the above answers are "NO", then may proceed with Cephalosporin use.    Marland Kitchen. Morphine And Related Rash    Current Outpatient Medications  Medication Sig Dispense Refill  . ibuprofen (ADVIL,MOTRIN) 200 MG tablet Take 400 mg by mouth every 6 (six) hours as needed for moderate pain.    Marland Kitchen. albuterol (PROVENTIL HFA;VENTOLIN HFA) 108 (90 BASE) MCG/ACT inhaler Inhale 2 puffs into the lungs every 6 (six) hours as needed for wheezing. For shortness of breath (Patient not taking: Reported on 11/26/2017) 1 Inhaler 2  . oxyCODONE-acetaminophen (PERCOCET/ROXICET) 5-325 MG tablet Take 1 tablet by mouth 3 (three) times daily. (Patient not taking: Reported on 12/28/2017) 10 tablet 0   No current facility-administered medications for this visit.     Review of Systems Review of Systems  She has  had unprotected IC since 2006 and has not conceived.  Blood pressure 107/67, pulse 94, weight 119 lb 6.4 oz (54.2 kg).  Physical Exam Physical Exam Breathing, conversing, and ambulating normally Well nourished, well hydrated White female, no apparent distress Abd- benign  Data Reviewed U/S Pelvis 11/26/17 IMPRESSION: Small LEFT ovarian cyst 3.1 cm diameter. Small amount of endometrial fluid with mild heterogeneity of endometrium, nonspecific. 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 No evidence of ovarian torsion  CT scan  11/26/17 IMPRESSION: 1.  No acute intra-abdominal process. 2. Prominent pelvic vasculature surrounding the uterus. Correlate for pelvic congestion syndrome.    Assess/ment    Pelvic pain, irregular periods    Plan    UPT today, No unprotected IC for 2 weeks, start OCPs then Come back 3 months       Chico Cawood C Raley Novicki 12/28/2017, 1:34 PM

## 2018-02-05 ENCOUNTER — Emergency Department (HOSPITAL_COMMUNITY)
Admission: EM | Admit: 2018-02-05 | Discharge: 2018-02-05 | Disposition: A | Discharge status: Home / Self Care | Payer: Self-pay | Attending: Emergency Medicine | Admitting: Emergency Medicine

## 2018-02-05 ENCOUNTER — Encounter (HOSPITAL_COMMUNITY): Payer: Self-pay | Admitting: Emergency Medicine

## 2018-02-05 ENCOUNTER — Other Ambulatory Visit: Payer: Self-pay

## 2018-02-05 DIAGNOSIS — F17210 Nicotine dependence, cigarettes, uncomplicated: Secondary | ICD-10-CM | POA: Insufficient documentation

## 2018-02-05 DIAGNOSIS — F14151 Cocaine abuse with cocaine-induced psychotic disorder with hallucinations: Secondary | ICD-10-CM | POA: Diagnosis present

## 2018-02-05 DIAGNOSIS — F1410 Cocaine abuse, uncomplicated: Secondary | ICD-10-CM

## 2018-02-05 DIAGNOSIS — R45851 Suicidal ideations: Secondary | ICD-10-CM | POA: Insufficient documentation

## 2018-02-05 LAB — COMPREHENSIVE METABOLIC PANEL
ALBUMIN: 3.5 g/dL (ref 3.5–5.0)
ALT: 14 U/L (ref 14–54)
AST: 16 U/L (ref 15–41)
Alkaline Phosphatase: 57 U/L (ref 38–126)
Anion gap: 9 (ref 5–15)
BILIRUBIN TOTAL: 0.4 mg/dL (ref 0.3–1.2)
BUN: 12 mg/dL (ref 6–20)
CO2: 21 mmol/L — ABNORMAL LOW (ref 22–32)
Calcium: 8.7 mg/dL — ABNORMAL LOW (ref 8.9–10.3)
Chloride: 108 mmol/L (ref 101–111)
Creatinine, Ser: 0.77 mg/dL (ref 0.44–1.00)
GFR calc Af Amer: >60 mL/min (ref >60)
GFR calc non Af Amer: >60 mL/min (ref >60)
GLUCOSE: 103 mg/dL — AB (ref 65–99)
Potassium: 4 mmol/L (ref 3.5–5.1)
Sodium: 138 mmol/L (ref 135–145)
TOTAL PROTEIN: 6.6 g/dL (ref 6.5–8.1)

## 2018-02-05 LAB — RAPID URINE DRUG SCREEN, HOSP PERFORMED
Amphetamines: NOT DETECTED
BARBITURATES: NOT DETECTED
BENZODIAZEPINES: NOT DETECTED
Cocaine: POSITIVE — AB
Opiates: NOT DETECTED
Tetrahydrocannabinol: NOT DETECTED

## 2018-02-05 LAB — SALICYLATE LEVEL: Salicylate Lvl: <7 mg/dL (ref 2.8–30.0)

## 2018-02-05 LAB — CBC
HEMATOCRIT: 38.1 % (ref 36.0–46.0)
Hemoglobin: 12.6 g/dL (ref 12.0–15.0)
MCH: 32 pg (ref 26.0–34.0)
MCHC: 33.1 g/dL (ref 30.0–36.0)
MCV: 96.7 fL (ref 78.0–100.0)
Platelets: 337 10*3/uL (ref 150–400)
RBC: 3.94 MIL/uL (ref 3.87–5.11)
RDW: 13.9 % (ref 11.5–15.5)
WBC: 8.5 10*3/uL (ref 4.0–10.5)

## 2018-02-05 LAB — ETHANOL: Alcohol, Ethyl (B): <10 mg/dL (ref <10)

## 2018-02-05 LAB — I-STAT BETA HCG BLOOD, ED (MC, WL, AP ONLY)

## 2018-02-05 LAB — ACETAMINOPHEN LEVEL: Acetaminophen (Tylenol), Serum: <10 ug/mL — ABNORMAL LOW (ref 10–30)

## 2018-02-05 MED ORDER — LORAZEPAM 1 MG PO TABS: 2.0000 mg | ORAL_TABLET | ORAL | Status: DC | PRN

## 2018-02-05 MED ORDER — HYDROXYZINE HCL 25 MG PO TABS: 50.0000 mg | ORAL_TABLET | Freq: Every day | ORAL | Status: DC

## 2018-02-05 MED ORDER — HYDROXYZINE HCL 50 MG PO TABS: 50.0000 mg | ORAL_TABLET | Freq: Every day | ORAL | 0 refills | Status: DC

## 2018-02-05 MED ORDER — LORAZEPAM 1 MG PO TABS
2.0000 mg | ORAL_TABLET | Freq: Once | ORAL | Status: AC
  Administered 2018-02-05: 2 mg via ORAL
  Filled 2018-02-05: qty 2

## 2018-02-05 MED ORDER — NICOTINE 14 MG/24HR TD PT24
14.0000 mg | MEDICATED_PATCH | Freq: Once | TRANSDERMAL | Status: DC
  Administered 2018-02-05: 14 mg via TRANSDERMAL
  Filled 2018-02-05: qty 1
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
## 2018-02-05 NOTE — ED Provider Notes (Signed)
[facility]-EMERGENCY DEPT Provider Note   CSN: [id number] Arrival date & time: 02/05/18  0033     History   Chief Complaint Chief Complaint  Patient presents with  . Suicidal  . Addiction Problem   Level 5 caveat due to psychiatric disorder HPI TALAYAH PICARDI is a 37 y.o. female.  The history is provided by the patient.  Mental Health Problem  Presenting symptoms: suicidal thoughts   Degree of incapacity (severity):  Moderate Onset quality:  Sudden Timing:  Constant Progression:  Worsening Chronicity:  New Context: drug abuse   Relieved by:  Nothing Worsened by:  Nothing  Patient reports recent cocaine abuse.  She reports she is having thoughts of harming herself.  She told nursing staff that she saw the devil sitting on her shoulder telling her to do drugs. Past Medical History:  Diagnosis Date  . Gallstones   . Medical history non-contributory     Patient Active Problem List   Diagnosis Date Noted  . Missed periods 12/28/2017  . Left ovarian cyst 12/28/2017  . Cholecystitis 06/17/2015    Past Surgical History:  Procedure Laterality Date  . CHOLECYSTECTOMY  06/17/2015  . CHOLECYSTECTOMY N/A 06/17/2015   Procedure: LAPAROSCOPIC CHOLECYSTECTOMY;  Surgeon: Emelia Loron, MD;  Location: [facility] OR;  Service: General;  Laterality: N/A;     OB History    Gravida  2   Para  2   Term  0   Preterm  2   AB      Living  2     SAB      TAB      Ectopic      Multiple      Live Births  2        Obstetric Comments  SVD at 36 wks. Not in the NICU. Delivered one in [place] and one at [facility] in [place]         Home Medications    Prior to Admission medications   Medication Sig Start Date End Date Taking? Authorizing Provider  albuterol (PROVENTIL HFA;VENTOLIN HFA) 108 (90 BASE) MCG/ACT inhaler Inhale 2 puffs into the lungs every 6 (six) hours as needed for wheezing. For shortness of breath Patient not taking:  Reported on 11/26/2017 10/15/12   Johnnette Gourd, MD  ibuprofen (ADVIL,MOTRIN) 200 MG tablet Take 400 mg by mouth every 6 (six) hours as needed for moderate pain.    [provider]  ibuprofen (ADVIL,MOTRIN) 800 MG tablet Take 1 tablet (800 mg total) by mouth every 8 (eight) hours as needed. 12/28/17   Allie Bossier, MD  norgestrel-ethinyl estradiol (LO/OVRAL,CRYSELLE) 0.3-30 MG-MCG tablet Take 1 tablet by mouth daily. 12/28/17   Allie Bossier, MD  oxyCODONE-acetaminophen (PERCOCET/ROXICET) 5-325 MG tablet Take 1 tablet by mouth 3 (three) times daily. Patient not taking: Reported on 12/28/2017 11/29/17   Rolm Bookbinder, CNM    Family History Family History  Problem Relation Age of Onset  . Asthma Mother   . Miscarriages / [place] Mother   . Learning disabilities Sister   . Mental illness Sister   . Mental retardation Sister     Social History Social History   Tobacco Use  . Smoking status: Current Every Day Smoker    Packs/day: 0.25    Types: Cigarettes  . Smokeless tobacco: Never Used  . Tobacco comment: declines  Substance Use Topics  . Alcohol use: Yes    Alcohol/week: 1.2 oz    Types: 2 Glasses  of wine per week  . Drug use: Yes    Types: Cocaine     Allergies   Penicillins and Morphine and related   Review of Systems Review of Systems  Unable to perform ROS: Psychiatric disorder  Psychiatric/Behavioral: Positive for suicidal ideas.     Physical Exam Updated Vital Signs BP 114/70 (BP Location: Left Arm)   Pulse 72   Temp 98.4 F (36.9 C) (Oral)   Resp 18   SpO2 99%   Physical Exam CONSTITUTIONAL: Disheveled and anxious HEAD: Normocephalic/atraumatic EYES: EOMI ENMT: Mucous membranes moist NECK: supple no meningeal signs SPINE/BACK:entire spine nontender CV: S1/S2 noted, no murmurs/rubs/gallops noted LUNGS: Lungs are clear to auscultation bilaterally, no apparent distress ABDOMEN: soft, nontendertenderness NEURO: Pt is awake/alert/appropriate,  moves all extremitiesx4.  No facial droop.  No arm or leg drift EXTREMITIES: pulses normal/equal, full ROM SKIN: warm, color normal PSYCH: Anxious   ED Treatments / Results  Labs (all labs ordered are listed, but only abnormal results are displayed) Labs Reviewed  COMPREHENSIVE METABOLIC PANEL - Abnormal; Notable for the following components:      Result Value   CO2 21 (*)    Glucose, Bld 103 (*)    Calcium 8.7 (*)    All other components within normal limits  ACETAMINOPHEN LEVEL - Abnormal; Notable for the following components:   Acetaminophen (Tylenol), Serum <10 (*)    All other components within normal limits  RAPID URINE DRUG SCREEN, HOSP PERFORMED - Abnormal; Notable for the following components:   Cocaine POSITIVE (*)    All other components within normal limits  ETHANOL  SALICYLATE LEVEL  CBC  I-STAT BETA HCG BLOOD, ED ([facility], [facility], [facility] ONLY)    EKG None  Radiology No results found.  Procedures Procedures (including critical care time)  Medications Ordered in ED Medications  nicotine (NICODERM CQ - dosed in mg/24 hours) patch 14 mg (14 mg Transdermal Patch Applied 02/05/18 0211)  LORazepam (ATIVAN) tablet 2 mg (has no administration in time range)  LORazepam (ATIVAN) tablet 2 mg (2 mg Oral Given 02/05/18 0212)     Initial Impression / Assessment and Plan / ED Course  I have reviewed the triage vital signs and the nursing notes.  Pertinent labs  results that were available during my care of the patient were reviewed by me and considered in my medical decision making (see chart for details).     She wanted to leave, but after my evaluation she agreed to stay.  Psychiatric disposition is pending.  She is medically stable at this time  Final Clinical Impressions(s) / ED Diagnoses   Final diagnoses:  Cocaine abuse [facility](HCC)    ED Discharge Orders    None       Zadie RhineWickline, Evan Mackie, MD 02/05/18 [phone]

## 2018-02-05 NOTE — Patient Outreach (Signed)
ED Peer Support Specialist Patient Intake (Complete at intake & 30-60 Day Follow-up)  Name: Olivia Mcdaniel  MRN: [id number]  Age: 37 y.o.   Date of Admission: 02/05/2018  Intake: Initial Comments:      Primary Reason Admitted: Substance Abuse   Lab values: Alcohol/ETOH: Negative Positive UDS? Yes Amphetamines: No Barbiturates: No Benzodiazepines: No Cocaine: Yes Opiates: No Cannabinoids: No  Demographic information: Gender: Identifies as female Ethnicity: White Marital Status: Single Insurance Status: Uninsured/Self-pay Ecologist (Work Neurosurgeon, Physicist, medical, etc.: No Lives with: Alone Living situation: House/Apartment  Reported Patient History: Patient reported health conditions: Anxiety disorders, Bipolar disorder, Depression Patient aware of HIV and hepatitis status: No  In past year, has patient visited ED for any reason? No  Number of ED visits:    Reason(s) for visit:    In past year, has patient been hospitalized for any reason? Yes  Number of hospitalizations: 5  Reason(s) for hospitalization: Various situations   In past year, has patient been arrested? No  Number of arrests:    Reason(s) for arrest:    In past year, has patient been incarcerated? No  Number of incarcerations:    Reason(s) for incarceration:    In past year, has patient received medication-assisted treatment? No  In past year, patient received the following treatments:    In past year, has patient received any harm reduction services? No  Did this include any of the following?    In past year, has patient received care from a mental health provider for diagnosis other than SUD? No  In past year, is this first time patient has overdosed? No  Number of past overdoses:    In past year, is this first time patient has been hospitalized for an overdose? No  Number of hospitalizations for overdose(s):    Is patient currently receiving  treatment for a mental health diagnosis? No  Patient reports experiencing difficulty participating in SUD treatment: No    Most important reason(s) for this difficulty?    Has patient received prior services for treatment? No  In past, patient has received services from following agencies:    Plan of Care:  Suggested follow up at these agencies/treatment centers: Other (comment)  Other information: CPSS met with Pt and was able to monitor services as well as see what services Pt wanted. CPSS talked with Pt to understand what services Pt wanted. CPSS was made aware that Pt just wanted to get out the hospital and get back to her boyfriend. CPSS gave Pt contact information so that Pt can stay in contact with CPSS in the community. CPSS addressed the fact that Pt needs to continue to make the effort to better the quality of her life.    Aaron Edelman Nadene Witherspoon, CPSS  02/05/2018 9:50 AM

## 2018-02-05 NOTE — BHH Suicide Risk Assessment (Signed)
Suicide Risk Assessment  Discharge Assessment   [facility] Discharge Suicide Risk Assessment   Principal Problem: Cocaine abuse with cocaine-induced psychotic disorder, with hallucinations [facility]) Discharge Diagnoses:  Patient Active Problem List   Diagnosis Date Noted  . Cocaine abuse with cocaine-induced psychotic disorder, with hallucinations ([place]) [F14.151] 02/05/2018    Priority: High  . Missed periods [N92.6] 12/28/2017  . Left ovarian cyst [N83.202] 12/28/2017  . Cholecystitis [K81.9] 06/17/2015    Total Time spent with patient: 45 minutes  Musculoskeletal: Strength & Muscle Tone: within normal limits Gait & Station: normal Patient leans: N/A  Psychiatric Specialty Exam:   Blood pressure 94/70, pulse 65, temperature 98.5 F (36.9 C), temperature source Oral, resp. rate 18, SpO2 100 %.There is no height or weight on file to calculate BMI.  General Appearance: Disheveled  Eye Contact::  Good  Speech:  Clear and Coherent and Normal Rate409  Volume:  Normal  Mood:  Euthymic  Affect:  Congruent  Thought Process:  Coherent and Descriptions of Associations: Intact  Orientation:  Full (Time, Place, and Person)  Thought Content:  WDL and Logical  Suicidal Thoughts:  No  Homicidal Thoughts:  No  Memory:  Immediate;   Good Recent;   Good Remote;   Good  Judgement:  Fair  Insight:  Fair  Psychomotor Activity:  Normal  Concentration:  Good  Recall:  [place] of Knowledge:Fair  Language: Good  Akathisia:  No  Handed:  Right  AIMS (if indicated):     Assets:  Leisure Time Physical Health Resilience Social Support  Sleep:     Cognition: WNL  ADL's:  Intact   Mental Status Per Nursing Assessment::   On Admission:   cocaine abuse with hallucinations.  She came under the influence of cocaine and said a devil was on her shoulder telling her to use drugs.  This morning, she is clear and coherent with no suicidal/homicidal ideations, hallucinations, or withdrawal symptoms.  Peer  support met with her and provided resources, stable for discharge.  Demographic Factors:  Caucasian  Loss Factors: NA  Historical Factors: NA  Risk Reduction Factors:   Sense of responsibility to family, Living with another person, especially a relative and Positive social support  Continued Clinical Symptoms:  None  Cognitive Features That Contribute To Risk:  None    Suicide Risk:  Minimal: No identifiable suicidal ideation.  Patients presenting with no risk factors but with morbid ruminations; may be classified as minimal risk based on the severity of the depressive symptoms    Plan Of Care/Follow-up recommendations:  Activity:  as tolerated Diet:  heart healthy diet  Olivia Bushard, NP 02/05/2018, 9:54 AM

## 2018-02-05 NOTE — Progress Notes (Signed)
TTS consulted with Donell SievertSpencer Simon, PA who recommends continued observation for safety and stabilization and to be reassessed in the AM by psych. EDP Dr. Bebe ShaggyWickline  MD and pt's nurse have been advised of the disposition.  Princess BruinsAquicha Erek Kowal, MSW, LCSW Therapeutic Triage Specialist  [phone]

## 2018-02-05 NOTE — ED Notes (Signed)
Patient yelling and crying on arrival to stretcher D.

## 2018-02-05 NOTE — ED Triage Notes (Signed)
Patient is suicidal. Patient has been sober off cocaine. Patient has the devil sitting on her shoulder telling her go do drugs. Patient states she wants to kill herself.

## 2018-02-05 NOTE — ED Notes (Signed)
Pt has 2 belonging bags 

## 2018-02-05 NOTE — BH Assessment (Addendum)
Assessment Note  Olivia Mcdaniel is an 37 y.o. female who presents to the ED voluntarily due to increased cocaine abuse and suicidal thoughts. Pt reported to triage that she is suicidal without a plan, however when TTS assessed the pt she denied that she is suicidal and states that she would never harm herself because of her children. Pt is inconsistent while she is in the ED and often reports controversial information to various ED staff. Pt stated she has been stressed due to living with her fiance's family and being threatened to be put out of the home often. Pt states she does not know if her fiance's mother will put them out of the home and it is very unstable. Pt also states she is unemployed and has no income. Pt states she lost custody of her children in 2009 and she began using cocaine after she lost custody. Pt states she uses cocaine daily but denies any other drug use.   Pt denies any prior psych hx. Pt admits that she scratches herself when she feels anxious. Pt states she has not been sleeping for the past 2-3 days.   Pt denies HI and denies AVH however she stated she feels there is a "devil on her shoulder" that is making her use drugs.   TTS consulted with Donell Sievert, PA who recommends continued observation for safety and stabilization and to be reassessed in the AM by psych. EDP Dr. Bebe Shaggy  MD and pt's nurse have been advised of the disposition.  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 Gallstones   . Medical history non-contributory     Past Surgical History:  Procedure Laterality Date  . CHOLECYSTECTOMY  06/17/2015  . CHOLECYSTECTOMY N/A 06/17/2015   Procedure: LAPAROSCOPIC CHOLECYSTECTOMY;  Surgeon: Emelia Loron, MD;  Location: [facility] OR;  Service: General;  Laterality: N/A;    Family History:  Family History  Problem Relation Age of Onset  . Asthma Mother    . Miscarriages / [place] Mother   . Learning disabilities Sister   . Mental illness Sister   . Mental retardation Sister     Social History:  reports that she has been smoking cigarettes.  She has been smoking about 0.25 packs per day. She has never used smokeless tobacco. She reports that she drinks about 1.2 oz of alcohol per week. She reports that she has current or past drug history. Drug: Cocaine.  Additional Social History:  Alcohol / Drug Use Pain Medications: See MAR Prescriptions: See MAR Over the Counter: See MAR History of alcohol / drug use?: Yes Substance #1 Name of Substance 1: Cocaine 1 - Age of First Use: year 2009 1 - Amount (size/oz): excessive 1 - Frequency: daily 1 - Duration: ongoing 1 - Last Use / Amount: 02/04/18  CIWA: CIWA-Ar BP: 114/70 Pulse Rate: 72 COWS:    Allergies:  Allergies  Allergen Reactions  . Penicillins     Unsure  Has patient had a PCN reaction causing immediate rash, facial/tongue/throat swelling, SOB or lightheadedness with hypotension: Yes Has patient had a PCN reaction causing severe rash involving mucus membranes or skin necrosis: No Has patient had a PCN reaction that required hospitalization: No Has patient had a PCN reaction occurring within the last 10 years: Yes If all of the above answers are "NO", then may proceed with Cephalosporin use.    Marland Kitchen Morphine And Related Rash  Home Medications:  (Not in a hospital admission)  OB/GYN Status:  No LMP recorded. (Menstrual status: Irregular Periods).  General Assessment Data Location of Assessment: [facility] TTS Assessment: In system Is this a Tele or Face-to-Face Assessment?: Face-to-Face Is this an Initial Assessment or a Re-assessment for this encounter?: Initial Assessment Marital status: Long term relationship(engaged ) Is patient pregnant?: No Pregnancy Status: No Living Arrangements: Spouse/significant other Can pt return to current living arrangement?:  Yes Admission Status: Voluntary Is patient capable of signing voluntary admission?: Yes Referral Source: Self/Family/Friend Insurance type: none     Crisis Care Plan Living Arrangements: Spouse/significant other Name of Psychiatrist: pt denies Name of Therapist: pt denies   Education Status Is patient currently in school?: No Is the patient employed, unemployed or receiving disability?: Unemployed  Risk to self with the past 6 months Suicidal Ideation: No-Not Currently/Within Last 6 Months Has patient been a risk to self within the past 6 months prior to admission? : No Suicidal Intent: No Has patient had any suicidal intent within the past 6 months prior to admission? : No Is patient at risk for suicide?: Yes Suicidal Plan?: No Has patient had any suicidal plan within the past 6 months prior to admission? : No Access to Means: No What has been your use of drugs/alcohol within the last 12 months?: admits to daily cocaine use  Previous Attempts/Gestures: No Triggers for Past Attempts: None known Intentional Self Injurious Behavior: Cutting Comment - Self Injurious Behavior: pt states she scratches herself when anxious  Family Suicide History: No Recent stressful life event(s): Financial Problems, Job Loss, Other (Comment)(drug use) Persecutory voices/beliefs?: No Depression: Yes Depression Symptoms: Insomnia, Despondent, Feeling worthless/self pity, Loss of interest in usual pleasures Substance abuse history and/or treatment for substance abuse?: Yes Suicide prevention information given to non-admitted patients: Not applicable  Risk to Others within the past 6 months Homicidal Ideation: No Does patient have any lifetime risk of violence toward others beyond the six months prior to admission? : No Thoughts of Harm to Others: No Current Homicidal Intent: No Current Homicidal Plan: No Access to Homicidal Means: No History of harm to others?: No Assessment of Violence: None  Noted Does patient have access to weapons?: No Criminal Charges Pending?: No Does patient have a court date: No Is patient on probation?: No  Psychosis Hallucinations: None noted Delusions: None noted  Mental Status Report Appearance/Hygiene: Disheveled, In scrubs Eye Contact: Good Motor Activity: Freedom of movement Speech: Logical/coherent Level of Consciousness: Alert Mood: Anxious, Depressed Affect: Anxious, Depressed Anxiety Level: Severe Thought Processes: Relevant, Coherent Judgement: Impaired Orientation: Person, Place, Time, Situation, Appropriate for developmental age Obsessive Compulsive Thoughts/Behaviors: None  Cognitive Functioning Concentration: Normal Memory: Recent Intact, Remote Intact Is patient IDD: No Is patient DD?: No Insight: Poor Impulse Control: Poor Appetite: Good Have you had any weight changes? : No Change Sleep: Decreased Total Hours of Sleep: 3 Vegetative Symptoms: None  ADLScreening [facility] Assessment Services) Patient's cognitive ability adequate to safely complete daily activities?: Yes Patient able to express need for assistance with ADLs?: Yes Independently performs ADLs?: Yes (appropriate for developmental age)  Prior Inpatient Therapy Prior Inpatient Therapy: No  Prior Outpatient Therapy Prior Outpatient Therapy: No Does patient have an ACCT team?: No Does patient have Intensive In-House Services?  : No Does patient have [facility] services? : No Does patient have P4CC services?: No  ADL Screening (condition at time of admission) Patient's cognitive ability adequate to safely complete daily activities?: Yes Is the patient  deaf or have difficulty hearing?: No Does the patient have difficulty seeing, even when wearing glasses/contacts?: No Does the patient have difficulty concentrating, remembering, or making decisions?: No Patient able to express need for assistance with ADLs?: Yes Does the patient have difficulty dressing or  bathing?: No Independently performs ADLs?: Yes (appropriate for developmental age) Does the patient have difficulty walking or climbing stairs?: No Weakness of Legs: None Weakness of Arms/Hands: None  Home Assistive Devices/Equipment Home Assistive Devices/Equipment: None    Abuse/Neglect Assessment (Assessment to be complete while patient is alone) Abuse/Neglect Assessment Can Be Completed: Yes Physical Abuse: Denies Verbal Abuse: Denies Sexual Abuse: Denies Exploitation of patient/patient's resources: Denies Self-Neglect: Denies     Merchant navy officerAdvance Directives (For Healthcare) Does Patient Have a Medical Advance Directive?: No Would patient like information on creating a medical advance directive?: No - Patient declined    Additional Information 1:1 In Past 12 Months?: No CIRT Risk: No Elopement Risk: No Does patient have medical clearance?: Yes     Disposition: TTS consulted with Donell SievertSpencer Simon, PA who recommends continued observation for safety and stabilization and to be reassessed in the AM by psych. EDP Dr. Bebe ShaggyWickline  MD and pt's nurse have been advised of the disposition.  Disposition Initial Assessment Completed for this Encounter: Yes Disposition of Patient: (overnight OBS pending AM reassessment by psych ) Patient refused recommended treatment: No  On Site Evaluation by:   Reviewed with Physician:    Karolee OhsAquicha R Priscillia Fouch 02/05/2018 3:10 AM

## 2018-02-14 ENCOUNTER — Encounter (HOSPITAL_COMMUNITY): Payer: Self-pay

## 2018-02-14 ENCOUNTER — Inpatient Hospital Stay (HOSPITAL_COMMUNITY)
Admission: AD | Admit: 2018-02-14 | Discharge: 2018-02-14 | Discharge status: Against Medical Advice | Payer: Self-pay | Source: Ambulatory Visit | Attending: Obstetrics and Gynecology | Admitting: Obstetrics and Gynecology

## 2018-02-14 DIAGNOSIS — F17210 Nicotine dependence, cigarettes, uncomplicated: Secondary | ICD-10-CM | POA: Insufficient documentation

## 2018-02-14 DIAGNOSIS — R109 Unspecified abdominal pain: Secondary | ICD-10-CM | POA: Insufficient documentation

## 2018-02-14 DIAGNOSIS — Z880 Allergy status to penicillin: Secondary | ICD-10-CM | POA: Insufficient documentation

## 2018-02-14 DIAGNOSIS — R112 Nausea with vomiting, unspecified: Secondary | ICD-10-CM | POA: Insufficient documentation

## 2018-02-14 DIAGNOSIS — R102 Pelvic and perineal pain: Secondary | ICD-10-CM | POA: Insufficient documentation

## 2018-02-14 HISTORY — DX: Unspecified ovarian cyst, unspecified side: N83.209

## 2018-02-14 LAB — LIPASE, BLOOD: Lipase: 26 U/L (ref 11–51)

## 2018-02-14 LAB — COMPREHENSIVE METABOLIC PANEL
ALBUMIN: 3.3 g/dL — AB (ref 3.5–5.0)
ALT: 11 U/L — AB (ref 14–54)
ANION GAP: 7 (ref 5–15)
AST: 15 U/L (ref 15–41)
Alkaline Phosphatase: 56 U/L (ref 38–126)
BUN: 9 mg/dL (ref 6–20)
CHLORIDE: 108 mmol/L (ref 101–111)
CO2: 23 mmol/L (ref 22–32)
Calcium: 8.3 mg/dL — ABNORMAL LOW (ref 8.9–10.3)
Creatinine, Ser: 0.71 mg/dL (ref 0.44–1.00)
GFR calc non Af Amer: >60 mL/min (ref >60)
GLUCOSE: 115 mg/dL — AB (ref 65–99)
Potassium: 3.7 mmol/L (ref 3.5–5.1)
SODIUM: 138 mmol/L (ref 135–145)
Total Bilirubin: 0.6 mg/dL (ref 0.3–1.2)
Total Protein: 6.4 g/dL — ABNORMAL LOW (ref 6.5–8.1)

## 2018-02-14 LAB — RAPID URINE DRUG SCREEN, HOSP PERFORMED
AMPHETAMINES: NOT DETECTED
BARBITURATES: NOT DETECTED
BENZODIAZEPINES: NOT DETECTED
Cocaine: POSITIVE — AB
Opiates: NOT DETECTED
TETRAHYDROCANNABINOL: NOT DETECTED

## 2018-02-14 LAB — WET PREP, GENITAL
SPERM: NONE SEEN
TRICH WET PREP: NONE SEEN
YEAST WET PREP: NONE SEEN

## 2018-02-14 LAB — URINALYSIS, ROUTINE W REFLEX MICROSCOPIC
BILIRUBIN URINE: NEGATIVE
Glucose, UA: NEGATIVE mg/dL
KETONES UR: NEGATIVE mg/dL
Leukocytes, UA: NEGATIVE
NITRITE: NEGATIVE
PROTEIN: NEGATIVE mg/dL
Specific Gravity, Urine: 1.02 (ref 1.005–1.030)
pH: 9 — ABNORMAL HIGH (ref 5.0–8.0)

## 2018-02-14 LAB — CBC
HCT: 36.1 % (ref 36.0–46.0)
HEMOGLOBIN: 12.1 g/dL (ref 12.0–15.0)
MCH: 31.7 pg (ref 26.0–34.0)
MCHC: 33.5 g/dL (ref 30.0–36.0)
MCV: 94.5 fL (ref 78.0–100.0)
PLATELETS: 351 10*3/uL (ref 150–400)
RBC: 3.82 MIL/uL — AB (ref 3.87–5.11)
RDW: 14.1 % (ref 11.5–15.5)
WBC: 13.9 10*3/uL — ABNORMAL HIGH (ref 4.0–10.5)

## 2018-02-14 LAB — POCT PREGNANCY, URINE: PREG TEST UR: NEGATIVE

## 2018-02-14 MED ORDER — NAPROXEN 500 MG PO TABS: 500.0000 mg | ORAL_TABLET | Freq: Two times a day (BID) | ORAL | 2 refills | Status: DC

## 2018-02-14 MED ORDER — NORGESTIMATE-ETH ESTRADIOL 0.25-35 MG-MCG PO TABS: 1.0000 | ORAL_TABLET | Freq: Every day | ORAL | 11 refills | Status: DC

## 2018-02-14 MED ORDER — KETOROLAC TROMETHAMINE 60 MG/2ML IM SOLN
60.0000 mg | Freq: Once | INTRAMUSCULAR | Status: AC
  Administered 2018-02-14: 60 mg via INTRAMUSCULAR
  Filled 2018-02-14: qty 2

## 2018-02-14 NOTE — Progress Notes (Signed)
Pt requesting to leave AMA, pt advised to stay and receive US per provider orders.  Pt states she understands but needs to leave because her ride is here.  Risk of leaving without advised care reviewed.    AMA papers signed.

## 2018-02-14 NOTE — MAU Note (Signed)
Pt states she has a "knot" on her back that hurts that is causing pain and nausea, she states she has a h/o of ovarian cysts and she isn't sure if that is what the pain is from.  She states she has also had vaginal bleeding and isn't sure if it is her period or from the cysts.  Pt states she passed a clot today the size of a golf ball.

## 2018-02-14 NOTE — MAU Provider Note (Signed)
Chief Complaint: Vaginal Bleeding   First Provider Initiated Contact with Patient 02/14/18 1507      SUBJECTIVE HPI: Olivia Mcdaniel is a 37 y.o. G2P0202 who presents to maternity admissions reporting severe right lower back pain, right lower abdominal pain, and n/v x 4 days.  She has hx of ovarian cyst earlier this year and reports her symptoms are similar.  She has taken ibuprofen but it has not helped.  The pain feels like "a knot in my back" and radiates around to sharp abdominal pain on the right.  The nausea is constant with intermittent vomiting x 4-5 in 48 hours.  There are no other symptoms. She has not tried any other treatments.    HPI  Past Medical History:  Diagnosis Date  . Gallstones   . Medical history non-contributory   . Ovarian cyst    Past Surgical History:  Procedure Laterality Date  . CHOLECYSTECTOMY  06/17/2015  . CHOLECYSTECTOMY N/A 06/17/2015   Procedure: LAPAROSCOPIC CHOLECYSTECTOMY;  Surgeon: Emelia Loron, MD;  Location: [facility] OR;  Service: General;  Laterality: N/A;   Social History   Socioeconomic History  . Marital status: Single    Spouse name: Not on file  . Number of children: Not on file  . Years of education: Not on file  . Highest education level: Not on file  Occupational History  . Not on file  Social Needs  . Financial resource strain: Not on file  . Food insecurity:    Worry: Not on file    Inability: Not on file  . Transportation needs:    Medical: Not on file    Non-medical: Not on file  Tobacco Use  . Smoking status: Current Every Day Smoker    Packs/day: 0.25    Types: Cigarettes  . Smokeless tobacco: Never Used  . Tobacco comment: declines  Substance and Sexual Activity  . Alcohol use: Yes    Alcohol/week: 1.2 oz    Types: 2 Glasses of wine per week  . Drug use: Yes    Types: Cocaine  . Sexual activity: Yes    Birth control/protection: Pill  Lifestyle  . Physical activity:    Days per week: Not on file   Minutes per session: Not on file  . Stress: Not on file  Relationships  . Social connections:    Talks on phone: Not on file    Gets together: Not on file    Attends religious service: Not on file    Active member of club or organization: Not on file    Attends meetings of clubs or organizations: Not on file    Relationship status: Not on file  . Intimate partner violence:    Fear of current or ex partner: Not on file    Emotionally abused: Not on file    Physically abused: Not on file    Forced sexual activity: Not on file  Other Topics Concern  . Not on file  Social History Narrative  . Not on file   No current facility-administered medications on file prior to encounter.    Current Outpatient Medications on File Prior to Encounter  Medication Sig Dispense Refill  . hydrOXYzine (ATARAX/VISTARIL) 50 MG tablet Take 1 tablet (50 mg total) by mouth at bedtime. 30 tablet 0  . albuterol (PROVENTIL HFA;VENTOLIN HFA) 108 (90 BASE) MCG/ACT inhaler Inhale 2 puffs into the lungs every 6 (six) hours as needed for wheezing. For shortness of breath (Patient not taking: Reported on 11/26/2017)  1 Inhaler 2   Allergies  Allergen Reactions  . Penicillins     Unsure  Has patient had a PCN reaction causing immediate rash, facial/tongue/throat swelling, SOB or lightheadedness with hypotension: Yes Has patient had a PCN reaction causing severe rash involving mucus membranes or skin necrosis: No Has patient had a PCN reaction that required hospitalization: No Has patient had a PCN reaction occurring within the last 10 years: Yes If all of the above answers are "NO", then may proceed with Cephalosporin use.    Marland Kitchen Morphine And Related Rash    ROS:  Review of Systems  Constitutional: Negative for chills, fatigue and fever.  Respiratory: Negative for shortness of breath.   Cardiovascular: Negative for chest pain.  Gastrointestinal: Positive for abdominal pain, nausea and vomiting. Negative for  constipation and diarrhea.  Genitourinary: Positive for pelvic pain. Negative for difficulty urinating, dysuria, flank pain, vaginal bleeding, vaginal discharge and vaginal pain.  Musculoskeletal: Positive for back pain.  Neurological: Negative for dizziness and headaches.  Psychiatric/Behavioral: Negative.      I have reviewed patient's Past Medical Hx, Surgical Hx, Family Hx, Social Hx, medications and allergies.   Physical Exam   Patient Vitals for the past 24 hrs:  BP Temp Temp src Resp  02/14/18 1402 100/74 98.4 F (36.9 C) Oral 17   Constitutional: Well-developed, well-nourished female in no acute distress.  Cardiovascular: normal rate Respiratory: normal effort GI: Abd soft, non-tender. Pos BS x 4 MS: Extremities nontender, no edema, normal ROM Neurologic: Alert and oriented x 4.  GU: Neg CVAT.  PELVIC EXAM: Cervix pink, visually closed, without lesion, light red bleeding, no fox swabs needed during exam, vaginal walls and external genitalia normal Bimanual exam: Cervix 0/long/high, firm, anterior, positive CMT, uterus mildly tender, nonenlarged, adnexa with significant tenderness on right, no mass or enlargement palpated, wnl on left   LAB RESULTS Results for orders placed or performed during the hospital encounter of 02/14/18 (from the past 24 hour(s))  Urinalysis, Routine w reflex microscopic     Status: Abnormal   Collection Time: 02/14/18  2:27 PM  Result Value Ref Range   Color, Urine YELLOW YELLOW   APPearance TURBID (A) CLEAR   Specific Gravity, Urine 1.020 1.005 - 1.030   pH 9.0 (H) 5.0 - 8.0   Glucose, UA NEGATIVE NEGATIVE mg/dL   Hgb urine dipstick SMALL (A) NEGATIVE   Bilirubin Urine NEGATIVE NEGATIVE   Ketones, ur NEGATIVE NEGATIVE mg/dL   Protein, ur NEGATIVE NEGATIVE mg/dL   Nitrite NEGATIVE NEGATIVE   Leukocytes, UA NEGATIVE NEGATIVE   Bacteria, UA RARE (A) NONE SEEN   Amorphous Crystal PRESENT    Triple Phosphate Crystal PRESENT   Urine rapid  drug screen (hosp performed)     Status: Abnormal   Collection Time: 02/14/18  2:27 PM  Result Value Ref Range   Opiates NONE DETECTED NONE DETECTED   Cocaine POSITIVE (A) NONE DETECTED   Benzodiazepines NONE DETECTED NONE DETECTED   Amphetamines NONE DETECTED NONE DETECTED   Tetrahydrocannabinol NONE DETECTED NONE DETECTED   Barbiturates NONE DETECTED NONE DETECTED  Pregnancy, urine POC     Status: None   Collection Time: 02/14/18  2:40 PM  Result Value Ref Range   Preg Test, Ur NEGATIVE NEGATIVE  CBC     Status: Abnormal   Collection Time: 02/14/18  2:43 PM  Result Value Ref Range   WBC 13.9 (H) 4.0 - 10.5 K/uL   RBC 3.82 (L) 3.87 - 5.11 MIL/uL  Hemoglobin 12.1 12.0 - 15.0 g/dL   HCT 16.136.1 09.636.0 - 04.546.0 %   MCV 94.5 78.0 - 100.0 fL   MCH 31.7 26.0 - 34.0 pg   MCHC 33.5 30.0 - 36.0 g/dL   RDW 40.914.1 81.111.5 - 91.415.5 %   Platelets 351 150 - 400 K/uL  Comprehensive metabolic panel     Status: Abnormal   Collection Time: 02/14/18  2:43 PM  Result Value Ref Range   Sodium 138 135 - 145 mmol/L   Potassium 3.7 3.5 - 5.1 mmol/L   Chloride 108 101 - 111 mmol/L   CO2 23 22 - 32 mmol/L   Glucose, Bld 115 (H) 65 - 99 mg/dL   BUN 9 6 - 20 mg/dL   Creatinine, Ser 7.820.71 0.44 - 1.00 mg/dL   Calcium 8.3 (L) 8.9 - 10.3 mg/dL   Total Protein 6.4 (L) 6.5 - 8.1 g/dL   Albumin 3.3 (L) 3.5 - 5.0 g/dL   AST 15 15 - 41 U/L   ALT 11 (L) 14 - 54 U/L   Alkaline Phosphatase 56 38 - 126 U/L   Total Bilirubin 0.6 0.3 - 1.2 mg/dL   GFR calc non Af Amer >60 >60 mL/min   GFR calc Af Amer >60 >60 mL/min   Anion gap 7 5 - 15  Lipase, blood     Status: None   Collection Time: 02/14/18  2:43 PM  Result Value Ref Range   Lipase 26 11 - 51 U/L  Wet prep, genital     Status: Abnormal   Collection Time: 02/14/18  3:30 PM  Result Value Ref Range   Yeast Wet Prep HPF POC NONE SEEN NONE SEEN   Trich, Wet Prep NONE SEEN NONE SEEN   Clue Cells Wet Prep HPF POC PRESENT (A) NONE SEEN   WBC, Wet Prep HPF POC MODERATE  (A) NONE SEEN   Sperm NONE SEEN        IMAGING No results found.  MAU Management/MDM: Physical exam with significant adnexal tenderness.  US ordered to evaluate for ovarian torsion.  Naproxen Rx and Sprintec Rx sent to pt pharmacy. Discussed change to full dose estrogen OCPs to improve pain/bleeding with menses. [facility]. Pt left AMA prior to US, indicating she could not stay any longer because her ride is here.  Precautions given regarding pain and reasons to return to MAU.     ASSESSMENT 1. Acute abdominal pain   2. Acute pelvic pain, female   3. Nausea and vomiting, intractability of vomiting not specified, unspecified vomiting type     PLAN Pt left AMA prior to full evaluation  Allergies as of 02/14/2018      Reactions   Penicillins    Unsure  Has patient had a PCN reaction causing immediate rash, facial/tongue/throat swelling, SOB or lightheadedness with hypotension: Yes Has patient had a PCN reaction causing severe rash involving mucus membranes or skin necrosis: No Has patient had a PCN reaction that required hospitalization: No Has patient had a PCN reaction occurring within the last 10 years: Yes If all of the above answers are "NO", then may proceed with Cephalosporin use.   Morphine And Related Rash      Medication List    STOP taking these medications   ibuprofen 200 MG tablet Commonly known as:  ADVIL,MOTRIN   norgestrel-ethinyl estradiol 0.3-30 MG-MCG tablet Commonly known as:  LO/OVRAL,CRYSELLE     TAKE these medications   albuterol 108 (90  Base) MCG/ACT inhaler Commonly known as:  PROVENTIL HFA;VENTOLIN HFA Inhale 2 puffs into the lungs every 6 (six) hours as needed for wheezing. For shortness of breath   hydrOXYzine 50 MG tablet Commonly known as:  ATARAX/VISTARIL Take 1 tablet (50 mg total) by mouth at bedtime.   naproxen 500 MG tablet Commonly known as:  NAPROSYN Take 1 tablet (500 mg total) by mouth 2 (two) times daily  with a meal.   norgestimate-ethinyl estradiol 0.25-35 MG-MCG tablet Commonly known as:  ORTHO-CYCLEN,SPRINTEC,PREVIFEM Take 1 tablet by mouth daily.        Sharen Counter Certified Nurse-Midwife 02/14/2018  9:14 PM

## 2018-02-14 NOTE — Progress Notes (Signed)
Pt asking to "go outside to smoke".  Pt told that she is not allowed to leave campus and that if she does leave she might not receive ultrasound.  Pt verbalizes understanding.  Pt not in room upon safety round.

## 2018-02-16 LAB — CULTURE, OB URINE

## 2018-02-16 LAB — GC/CHLAMYDIA PROBE AMP (~~LOC~~) NOT AT ARMC
Chlamydia: NEGATIVE
NEISSERIA GONORRHEA: NEGATIVE

## 2018-02-17 ENCOUNTER — Telehealth: Payer: Self-pay | Admitting: Obstetrics and Gynecology

## 2018-02-17 DIAGNOSIS — N390 Urinary tract infection, site not specified: Secondary | ICD-10-CM

## 2018-02-17 MED ORDER — SULFAMETHOXAZOLE-TRIMETHOPRIM 800-160 MG PO TABS: 1.0000 | ORAL_TABLET | Freq: Two times a day (BID) | ORAL | 0 refills | Status: DC

## 2018-02-17 NOTE — Telephone Encounter (Signed)
[facility] [address]   Call #1 to mobile @ 1058: busy Call #2 to mobile @ 1349: busy; home no answer Call #3 to home @ 1350: no answer - recording stating "the person you are trying to reach is unable to take calls at this time".  Raelyn MoraRolitta Nikya Busler, CNM  02/17/2018 2:27 PM

## 2018-03-01 ENCOUNTER — Encounter (HOSPITAL_COMMUNITY): Payer: Self-pay

## 2018-03-01 ENCOUNTER — Emergency Department (HOSPITAL_COMMUNITY)
Admission: EM | Admit: 2018-03-01 | Discharge: 2018-03-01 | Disposition: A | Discharge status: Home / Self Care | Payer: Self-pay | Attending: Emergency Medicine | Admitting: Emergency Medicine

## 2018-03-01 ENCOUNTER — Emergency Department (HOSPITAL_COMMUNITY): Payer: Self-pay

## 2018-03-01 DIAGNOSIS — N83209 Unspecified ovarian cyst, unspecified side: Secondary | ICD-10-CM | POA: Insufficient documentation

## 2018-03-01 DIAGNOSIS — F17210 Nicotine dependence, cigarettes, uncomplicated: Secondary | ICD-10-CM | POA: Insufficient documentation

## 2018-03-01 DIAGNOSIS — K029 Dental caries, unspecified: Secondary | ICD-10-CM | POA: Insufficient documentation

## 2018-03-01 DIAGNOSIS — K0889 Other specified disorders of teeth and supporting structures: Secondary | ICD-10-CM

## 2018-03-01 DIAGNOSIS — N3000 Acute cystitis without hematuria: Secondary | ICD-10-CM | POA: Insufficient documentation

## 2018-03-01 DIAGNOSIS — Z79899 Other long term (current) drug therapy: Secondary | ICD-10-CM | POA: Insufficient documentation

## 2018-03-01 DIAGNOSIS — R52 Pain, unspecified: Secondary | ICD-10-CM

## 2018-03-01 LAB — WET PREP, GENITAL
SPERM: NONE SEEN
Trich, Wet Prep: NONE SEEN
WBC WET PREP: NONE SEEN
YEAST WET PREP: NONE SEEN

## 2018-03-01 LAB — URINALYSIS, ROUTINE W REFLEX MICROSCOPIC
Bilirubin Urine: NEGATIVE
Glucose, UA: NEGATIVE mg/dL
KETONES UR: NEGATIVE mg/dL
Nitrite: POSITIVE — AB
PH: 5 (ref 5.0–8.0)
Protein, ur: NEGATIVE mg/dL
SPECIFIC GRAVITY, URINE: 1.018 (ref 1.005–1.030)

## 2018-03-01 LAB — GC/CHLAMYDIA PROBE AMP (~~LOC~~) NOT AT ARMC
CHLAMYDIA, DNA PROBE: NEGATIVE
Neisseria Gonorrhea: NEGATIVE

## 2018-03-01 LAB — POC URINE PREG, ED: Preg Test, Ur: NEGATIVE

## 2018-03-01 MED ORDER — IBUPROFEN 400 MG PO TABS: 800.0000 mg | ORAL_TABLET | Freq: Three times a day (TID) | ORAL | 0 refills | Status: DC | PRN

## 2018-03-01 MED ORDER — ACETAMINOPHEN 500 MG PO TABS
1000.0000 mg | ORAL_TABLET | Freq: Once | ORAL | Status: AC
  Administered 2018-03-01: 1000 mg via ORAL
  Filled 2018-03-01: qty 2

## 2018-03-01 MED ORDER — IBUPROFEN 800 MG PO TABS
ORAL_TABLET | ORAL | Status: AC
  Filled 2018-03-01: qty 1

## 2018-03-01 MED ORDER — CLINDAMYCIN HCL 150 MG PO CAPS
300.0000 mg | ORAL_CAPSULE | Freq: Once | ORAL | Status: AC
  Administered 2018-03-01: 300 mg via ORAL
  Filled 2018-03-01: qty 2

## 2018-03-01 MED ORDER — CLINDAMYCIN HCL 300 MG PO CAPS: 300.0000 mg | ORAL_CAPSULE | Freq: Four times a day (QID) | ORAL | 0 refills | Status: DC

## 2018-03-01 MED ORDER — CLINDAMYCIN HCL 300 MG PO CAPS
300.0000 mg | ORAL_CAPSULE | Freq: Once | ORAL | Status: AC
  Administered 2018-03-01: 300 mg via ORAL
  Filled 2018-03-01: qty 1

## 2018-03-01 MED ORDER — LIDOCAINE VISCOUS HCL 2 % MT SOLN
15.0000 mL | Freq: Once | OROMUCOSAL | Status: AC
  Administered 2018-03-01: 15 mL via OROMUCOSAL
  Filled 2018-03-01: qty 15

## 2018-03-01 MED ORDER — LIDOCAINE VISCOUS HCL 2 % MT SOLN: 15.0000 mL | OROMUCOSAL | 0 refills | Status: DC | PRN

## 2018-03-01 MED ORDER — CHLORHEXIDINE GLUCONATE 0.12% ORAL RINSE (MEDLINE KIT): 15.0000 mL | Freq: Two times a day (BID) | OROMUCOSAL | 0 refills | Status: DC

## 2018-03-01 MED ORDER — IBUPROFEN 800 MG PO TABS
800.0000 mg | ORAL_TABLET | Freq: Once | ORAL | Status: AC
  Administered 2018-03-01: 800 mg via ORAL

## 2018-03-01 MED ORDER — FOSFOMYCIN TROMETHAMINE 3 G PO PACK
3.0000 g | PACK | Freq: Once | ORAL | Status: AC
  Administered 2018-03-01: 3 g via ORAL
  Filled 2018-03-01: qty 3

## 2018-03-01 NOTE — ED Provider Notes (Signed)
[facility]-EMERGENCY DEPT Provider Note   CSN: [id number] Arrival date & time: 03/01/18  0033     History   Chief Complaint Chief Complaint  Patient presents with  . Dental Pain  . Ovarian Cyst    HPI Olivia Mcdaniel is a 37 y.o. female.  The history is provided by the patient.  Vaginal Bleeding  Primary symptoms include vaginal bleeding.  Primary symptoms include no discharge, no dyspareunia, no genital lesions, no genital pain, no genital rash, no genital itching, no genital odor, no dysuria. There has been no fever. This is a recurrent problem. The current episode started 2 days ago. The problem occurs constantly. The problem has not changed since onset.The symptoms occur spontaneously. She is not pregnant. She has not missed her period. Her LMP was days ago. Pertinent negatives include no anorexia and no abdominal pain. There is no concern regarding sexually transmitted diseases. She uses nothing for contraception. Associated medical issues do not include STD.  Dental Pain   This is a new problem. The current episode started more than 2 days ago. The problem occurs constantly. The problem has not changed since onset.The pain is at a severity of 10/10. The pain is severe. She has tried nothing for the symptoms. The treatment provided no relief.  Thinks she has a cyst as her menstrual bleeding has been worse than usual.    Past Medical History:  Diagnosis Date  . Gallstones   . Medical history non-contributory   . Ovarian cyst     Patient Active Problem List   Diagnosis Date Noted  . Cocaine abuse with cocaine-induced psychotic disorder, with hallucinations (HCC) 02/05/2018  . Missed periods 12/28/2017  . Left ovarian cyst 12/28/2017  . Cholecystitis 06/17/2015    Past Surgical History:  Procedure Laterality Date  . CHOLECYSTECTOMY  06/17/2015  . CHOLECYSTECTOMY N/A 06/17/2015   Procedure: LAPAROSCOPIC CHOLECYSTECTOMY;  Surgeon: Emelia LoronMatthew  Wakefield, MD;  Location: [facility] OR;  Service: General;  Laterality: N/A;     OB History    Gravida  2   Para  2   Term  0   Preterm  2   AB      Living  2     SAB      TAB      Ectopic      Multiple      Live Births  2        Obstetric Comments  SVD at 36 wks. Not in the NICU. [place] and [place] at [facility] [place]         Home Medications    Prior to Admission medications   Medication Sig Start Date End Date Taking? Authorizing Provider  albuterol (PROVENTIL HFA;VENTOLIN HFA) 108 (90 BASE) MCG/ACT inhaler Inhale 2 puffs into the lungs every 6 (six) hours as needed for wheezing. For shortness of breath Patient not taking: Reported on 11/26/2017 10/15/12   Johnnette GourdBoyd, Erin, MD  hydrOXYzine (ATARAX/VISTARIL) 50 MG tablet Take 1 tablet (50 mg total) by mouth at bedtime. 02/05/18   Charm RingsLord, Jamison Y, NP  naproxen (NAPROSYN) 500 MG tablet Take 1 tablet (500 mg total) by mouth 2 (two) times daily with a meal. 02/14/18   Leftwich-Kirby, Wilmer FloorLisa A, CNM  norgestimate-ethinyl estradiol (ORTHO-CYCLEN,SPRINTEC,PREVIFEM) 0.25-35 MG-MCG tablet Take 1 tablet by mouth daily. 02/14/18   Leftwich-Kirby, Wilmer FloorLisa A, CNM  sulfamethoxazole-trimethoprim (BACTRIM DS,SEPTRA DS) 800-160 MG tablet Take 1 tablet by mouth 2 (two) times daily. 02/17/18  Raelyn Mora, CNM    Family History Family History  Problem Relation Age of Onset  . Asthma Mother   . Miscarriages / [place] Mother   . Learning disabilities Sister   . Mental illness Sister   . Mental retardation Sister     Social History Social History   Tobacco Use  . Smoking status: Current Every Day Smoker    Packs/day: 0.50    Types: Cigarettes  . Smokeless tobacco: Never Used  . Tobacco comment: declines  Substance Use Topics  . Alcohol use: Yes    Alcohol/week: 1.2 oz    Types: 2 Glasses of wine per week  . Drug use: Yes    Types: Cocaine     Allergies   Penicillins and Morphine and related   Review of  Systems Review of Systems  Constitutional: Negative for fever.  HENT: Positive for dental problem. Negative for congestion and facial swelling.   Eyes: Negative for photophobia.  Respiratory: Negative for shortness of breath.   Cardiovascular: Negative for chest pain.  Gastrointestinal: Negative for abdominal pain and anorexia.  Genitourinary: Positive for vaginal bleeding. Negative for dyspareunia, dysuria, flank pain, genital sores, hematuria and vaginal discharge.  All other systems reviewed and are negative.    Physical Exam Updated Vital Signs BP 94/67 (BP Location: Right Arm)   Pulse 83   Temp (!) 97.4 F (36.3 C) (Oral)   Resp 16   Ht 5\' 3"  (1.6 m)   Wt 53.1 kg (117 lb)   SpO2 99%   BMI 20.73 kg/m   Physical Exam  Constitutional: She is oriented to person, place, and time. She appears well-developed and well-nourished. No distress.  HENT:  Head: Normocephalic and atraumatic.  Mouth/Throat: No oropharyngeal exudate.  Eyes: Pupils are equal, round, and reactive to light. Conjunctivae are normal.  Neck: Normal range of motion. Neck supple.  Cardiovascular: Normal rate, regular rhythm, normal heart sounds and intact distal pulses.  Pulmonary/Chest: Effort normal and breath sounds normal. No stridor. She has no wheezes. She has no rales.  Abdominal: Soft. Bowel sounds are normal. She exhibits no mass. There is no tenderness. There is no guarding.  Genitourinary: No vaginal discharge found.  Genitourinary Comments: Chaperone present, no cmt or adnexal tenderness or mass.  There is NO bleeding at all  Musculoskeletal: Normal range of motion.  Neurological: She is alert and oriented to person, place, and time.  Skin: Skin is warm.  Nursing note and vitals reviewed.    ED Treatments / Results  Labs (all labs ordered are listed, but only abnormal results are displayed) Results for orders placed or performed during the hospital encounter of 03/01/18  Wet prep, genital    Result Value Ref Range   Yeast Wet Prep HPF POC NONE SEEN NONE SEEN   Trich, Wet Prep NONE SEEN NONE SEEN   Clue Cells Wet Prep HPF POC PRESENT (A) NONE SEEN   WBC, Wet Prep HPF POC NONE SEEN NONE SEEN   Sperm NONE SEEN   Urinalysis, Routine w reflex microscopic  Result Value Ref Range   Color, Urine YELLOW YELLOW   APPearance CLEAR CLEAR   Specific Gravity, Urine 1.018 1.005 - 1.030   pH 5.0 5.0 - 8.0   Glucose, UA NEGATIVE NEGATIVE mg/dL   Hgb urine dipstick SMALL (A) NEGATIVE   Bilirubin Urine NEGATIVE NEGATIVE   Ketones, ur NEGATIVE NEGATIVE mg/dL   Protein, ur NEGATIVE NEGATIVE mg/dL   Nitrite POSITIVE (A) NEGATIVE   Leukocytes, UA  TRACE (A) NEGATIVE   RBC / HPF 0-5 0 - 5 RBC/hpf   WBC, UA 11-20 0 - 5 WBC/hpf   Bacteria, UA MANY (A) NONE SEEN   Squamous Epithelial / LPF 0-5 0 - 5   Mucus PRESENT    Hyaline Casts, UA PRESENT   POC Urine Pregnancy, ED (do NOT order at [facility])  Result Value Ref Range   Preg Test, Ur NEGATIVE NEGATIVE   US Pelvic Complete W Transvaginal And Torsion R/o  Result Date: 03/01/2018 CLINICAL DATA:  Pelvic, vaginal bleeding.  LMP 02/24/2018 EXAM: TRANSABDOMINAL AND TRANSVAGINAL ULTRASOUND OF PELVIS DOPPLER ULTRASOUND OF OVARIES TECHNIQUE: Both transabdominal and transvaginal ultrasound examinations of the pelvis were performed. Transabdominal technique was performed for global imaging of the pelvis including uterus, ovaries, adnexal regions, and pelvic cul-de-sac. It was necessary to proceed with endovaginal exam following the transabdominal exam to visualize the uterus, endometrium and ovaries. Color and duplex Doppler ultrasound was utilized to evaluate blood flow to the ovaries. COMPARISON:  11/26/2017 FINDINGS: Uterus Measurements: 9.2 x 4.7 x 6.2. No fibroids or other mass visualized. Endometrium Thickness: 14.3 mm.  No focal abnormality visualized. Right ovary Measurements: 2.7 x 1.5 x 1.9 cm. Normal appearance/no adnexal mass. Left ovary Measurements:  4.2 x 3.6 x 4.3 cm. Simple anechoic cyst measuring 3.2 x 2.7 x 3.2 cm. Pulsed Doppler evaluation of both ovaries demonstrates normal low-resistance arterial and venous waveforms. Other findings No abnormal free fluid.  Nabothian cysts noted at the cervix. IMPRESSION: Simple left ovarian cyst stable in appearance measuring 3.2 x 2.7 x 3.2 cm. This appears stable since prior. No uterine mass. Unremarkable endometrial stripe. Electronically Signed   By: Tollie Eth M.D.   On: 03/01/2018 03:23    Radiology No results found.  Procedures Procedures (including critical care time)  Medications Ordered in ED Medications  fosfomycin (MONUROL) packet 3 g (has no administration in time range)  clindamycin (CLEOCIN) capsule 300 mg (300 mg Oral Given 03/01/18 0058)  acetaminophen (TYLENOL) tablet 1,000 mg (1,000 mg Oral Given 03/01/18 0058)  ibuprofen (ADVIL,MOTRIN) tablet 800 mg (800 mg Oral Given 03/01/18 0128)      Final Clinical Impressions(s) / ED Diagnoses  Treated for UTI in the ED and given RX for clindamycin for dental caries and the resource guide.  No torsion no bleeding whatsoever.    Return for weakness, numbness, changes in vision or speech, fevers >100.4 unrelieved by medication, shortness of breath, intractable vomiting, or diarrhea, abdominal pain, Inability to tolerate liquids or food, cough, altered mental status or any concerns. No signs of systemic illness or infection. The patient is nontoxic-appearing on exam and vital signs are within normal limits. Will refer to urology for microscopy hematuria as patient is asymptomatic.    I have reviewed the triage vital signs and the nursing notes. Pertinent labs &imaging results that were available during my care of the patient were reviewed by me and considered in my medical decision making (see chart for details).  After history, exam, and medical workup I feel the patient has been appropriately medically screened and is safe for  discharge home. Pertinent diagnoses were discussed with the patient. Patient was given return precautions.    Tameko Halder, MD 03/01/18 [phone]

## 2018-03-01 NOTE — ED Triage Notes (Signed)
Patient here with ongoing dental pain after [facility]. Received prescriptions and states that she cannot afford the meds

## 2018-03-01 NOTE — ED Notes (Signed)
Bed: WA09 Expected date:  Expected time:  Means of arrival:  Comments: 37 yr old abdominal pain

## 2018-03-01 NOTE — ED Triage Notes (Signed)
Pt arrived by EMS from home. Pt reports that she has Dental Pain that started 2 days ago. Pt also reports that she has an Ovarian Cyst and has pain and bleeding that has been going on for the past 2 days.

## 2018-03-01 NOTE — ED Provider Notes (Signed)
Patient placed in Quick Look pathway, seen and evaluated   Chief Complaint: dental pain  HPI:   [facility], given ibuprofen , ice and clinda. Unable to afford meds. Came to ER  ROS: Dental pain  (one)  Physical Exam:   Gen: No distress  Neuro: Awake and Alert  Skin: Warm    Focused Exam: RRR, holdding ice pack on her face.   Initiation of care has begun. The patient has been counseled on the process, plan, and necessity for staying for the completion/evaluation, and the remainder of the medical screening examination    Arthor CaptainHarris, Kenzleigh Sedam, PA-C 03/01/18 1451    Margarita Grizzleay, Danielle, MD 03/01/18 1511

## 2018-03-01 NOTE — ED Provider Notes (Signed)
[facility] EMERGENCY DEPARTMENT Provider Note   CSN: [id number] Arrival date & time: 03/01/18  1448     History   Chief Complaint No chief complaint on file.   HPI Olivia Mcdaniel is a 37 y.o. female.  HPI  Patient is a 37 year old female with no significant past medical history presenting for dental pain.  Patient reports that she was seen for the same, however she could not afford her clindamycin prescription.  Patient reports that she has diffuse dental caries with dental pain at multiple sites.  Patient denies any difficulty breathing, difficulty swallowing, semitubular tenderness or neck induration.  No fever or chills.  Patient has been taking ibuprofen without relief.  Past Medical History:  Diagnosis Date  . Gallstones   . Medical history non-contributory   . Ovarian cyst     Patient Active Problem List   Diagnosis Date Noted  . Cocaine abuse with cocaine-induced psychotic disorder, with hallucinations ([place]) 02/05/2018  . Missed periods 12/28/2017  . Left ovarian cyst 12/28/2017  . Cholecystitis 06/17/2015    Past Surgical History:  Procedure Laterality Date  . CHOLECYSTECTOMY  06/17/2015  . CHOLECYSTECTOMY N/A 06/17/2015   Procedure: LAPAROSCOPIC CHOLECYSTECTOMY;  Surgeon: Rolm Bookbinder, MD;  Location: [facility] OR;  Service: General;  Laterality: N/A;     OB History    Gravida  2   Para  2   Term  0   Preterm  2   AB      Living  2     SAB      TAB      Ectopic      Multiple      Live Births  2        Obstetric Comments  SVD at 36 wks. Not in the NICU. Delivered one in [place] and one at [facility] in [place] Medications    Prior to Admission medications   Medication Sig Start Date End Date Taking? Authorizing Provider  albuterol (PROVENTIL HFA;VENTOLIN HFA) 108 (90 BASE) MCG/ACT inhaler Inhale 2 puffs into the lungs every 6 (six) hours as needed for wheezing. For shortness of breath Patient  not taking: Reported on 11/26/2017 10/15/12   Little Ishikawa, MD  chlorhexidine gluconate, MEDLINE KIT, (PERIDEX) 0.12 % solution Use as directed 15 mLs in the mouth or throat 2 (two) times daily. 03/01/18   Langston Masker B, PA-C  clindamycin (CLEOCIN) 300 MG capsule Take 1 capsule (300 mg total) by mouth 4 (four) times daily. X 7 days 03/01/18   Randal Buba, April, MD  hydrOXYzine (ATARAX/VISTARIL) 50 MG tablet Take 1 tablet (50 mg total) by mouth at bedtime. 02/05/18   Patrecia Pour, NP  ibuprofen (ADVIL,MOTRIN) 400 MG tablet Take 2 tablets (800 mg total) by mouth every 8 (eight) hours as needed for moderate pain. 03/01/18   Palumbo, April, MD  lidocaine (XYLOCAINE) 2 % solution Use as directed 15 mLs in the mouth or throat as needed for mouth pain. 03/01/18   Langston Masker B, PA-C  naproxen (NAPROSYN) 500 MG tablet Take 1 tablet (500 mg total) by mouth 2 (two) times daily with a meal. 02/14/18   Leftwich-Kirby, Kathie Dike, CNM  norgestimate-ethinyl estradiol (ORTHO-CYCLEN,SPRINTEC,PREVIFEM) 0.25-35 MG-MCG tablet Take 1 tablet by mouth daily. 02/14/18   Leftwich-Kirby, Kathie Dike, CNM  sulfamethoxazole-trimethoprim (BACTRIM DS,SEPTRA DS) 800-160 MG tablet Take 1 tablet by mouth 2 (two) times daily. 02/17/18   Laury Deep, CNM  Family History Family History  Problem Relation Age of Onset  . Asthma Mother   . Miscarriages / [place] Mother   . Learning disabilities Sister   . Mental illness Sister   . Mental retardation Sister     Social History Social History   Tobacco Use  . Smoking status: Current Every Day Smoker    Packs/day: 0.50    Types: Cigarettes  . Smokeless tobacco: Never Used  . Tobacco comment: declines  Substance Use Topics  . Alcohol use: Yes    Alcohol/week: 1.2 oz    Types: 2 Glasses of wine per week  . Drug use: Yes    Types: Cocaine     Allergies   Penicillins and Morphine and related   Review of Systems Review of Systems  Constitutional: Negative for chills and  fever.  HENT: Positive for dental problem. Negative for trouble swallowing and voice change.   Respiratory: Negative for shortness of breath, wheezing and stridor.   Gastrointestinal: Negative for nausea and vomiting.     Physical Exam Updated Vital Signs BP 98/66 (BP Location: Right Arm)   Pulse 78   Temp 98.5 F (36.9 C)   Resp 16   SpO2 100%   Physical Exam  Constitutional: She appears well-developed and well-nourished. No distress.  Sitting comfortably in bed.  HENT:  Head: Normocephalic and atraumatic.  Diffuse dental caries and poor dentition noted.  Patient has a grayish discoloration to the gums and friable gum tissue. No abscess noted. Midline uvula. No trismus. OP clear and moist. No oropharyngeal erythema or edema. Neck supple with no tenderness. No facial edema.  Eyes: Conjunctivae are normal. Right eye exhibits no discharge. Left eye exhibits no discharge.  EOMs normal to gross examination.  Neck: Normal range of motion.  Cardiovascular: Normal rate and regular rhythm.  Intact, 2+ radial pulse.  Pulmonary/Chest:  Normal respiratory effort. Patient converses comfortably. No audible wheeze or stridor.  Abdominal: She exhibits no distension.  Musculoskeletal: Normal range of motion.  Neurological: She is alert.  Cranial nerves intact to gross observation. Patient moves extremities without difficulty.  Skin: Skin is warm and dry. She is not diaphoretic.  Psychiatric: She has a normal mood and affect. Her behavior is normal. Judgment and thought content normal.  Nursing note and vitals reviewed.    ED Treatments / Results  Labs (all labs ordered are listed, but only abnormal results are displayed) Labs Reviewed - No data to display  EKG None  Radiology US Pelvic Complete W Transvaginal And Torsion R/o  Result Date: 03/01/2018 CLINICAL DATA:  Pelvic, vaginal bleeding.  LMP 02/24/2018 EXAM: TRANSABDOMINAL AND TRANSVAGINAL ULTRASOUND OF PELVIS DOPPLER  ULTRASOUND OF OVARIES TECHNIQUE: Both transabdominal and transvaginal ultrasound examinations of the pelvis were performed. Transabdominal technique was performed for global imaging of the pelvis including uterus, ovaries, adnexal regions, and pelvic cul-de-sac. It was necessary to proceed with endovaginal exam following the transabdominal exam to visualize the uterus, endometrium and ovaries. Color and duplex Doppler ultrasound was utilized to evaluate blood flow to the ovaries. COMPARISON:  11/26/2017 FINDINGS: Uterus Measurements: 9.2 x 4.7 x 6.2. No fibroids or other mass visualized. Endometrium Thickness: 14.3 mm.  No focal abnormality visualized. Right ovary Measurements: 2.7 x 1.5 x 1.9 cm. Normal appearance/no adnexal mass. Left ovary Measurements: 4.2 x 3.6 x 4.3 cm. Simple anechoic cyst measuring 3.2 x 2.7 x 3.2 cm. Pulsed Doppler evaluation of both ovaries demonstrates normal low-resistance arterial and venous waveforms. Other findings No abnormal free  fluid.  Nabothian cysts noted at the cervix. IMPRESSION: Simple left ovarian cyst stable in appearance measuring 3.2 x 2.7 x 3.2 cm. This appears stable since prior. No uterine mass. Unremarkable endometrial stripe. Electronically Signed   By: Ashley Royalty M.D.   On: 03/01/2018 03:23    Procedures Procedures (including critical care time)  Medications Ordered in ED Medications  acetaminophen (TYLENOL) tablet 1,000 mg (1,000 mg Oral Given 03/01/18 1650)  clindamycin (CLEOCIN) capsule 300 mg (300 mg Oral Given 03/01/18 1715)     Initial Impression / Assessment and Plan / ED Course  I have reviewed the triage vital signs and the nursing notes.  Pertinent labs & imaging results that were available during my care of the patient were reviewed by me and considered in my medical decision making (see chart for details).     STEPHANIEMARIE STOFFEL is a 37 y.o. female who presents to ED for dental pain.  Patient has a diffusely poor dentition with severe  gingivitis.  No abscess requiring immediate incision and drainage. Patient is afebrile, non toxic appearing, and swallowing secretions well. Exam not concerning for Ludwig's angina or pharyngeal abscess.  Provided patient with good Rx coupon to make clindamycin more affordable.  Additionally, provided chlorhexidine and lidocaine solution for symptomatic relief.  I provided dental resource guide and stressed the importance of dental follow up for ultimate management of dental pain. Patient voices understanding and is agreeable to plan.  Final Clinical Impressions(s) / ED Diagnoses   Final diagnoses:  Pain, dental    ED Discharge Orders        Ordered    chlorhexidine gluconate, MEDLINE KIT, (PERIDEX) 0.12 % solution  2 times daily     03/01/18 1709    lidocaine (XYLOCAINE) 2 % solution  As needed     03/01/18 1709       Tamala Julian 03/01/18 1843    Tanna Furry, MD 03/03/18 [phone]

## 2018-03-01 NOTE — ED Notes (Signed)
US at bedside

## 2018-03-01 NOTE — ED Notes (Signed)
Patient called back to room, found in lobby refreshment area eating sub sandwich, cookie and drinking a soda.  No distress noted at this time.

## 2018-03-01 NOTE — Discharge Instructions (Addendum)
Please see the information and instructions below regarding your visit.  Your diagnoses today include:  1. Pain, dental    You have a dental infection. It is very important that you get evaluated by a dentist as soon as possible. Call tomorrow to schedule an appointment. Ibuprofen as needed for pain. Take your full course of antibiotics. Read the instructions below.  Tests performed today include: See side panel of your discharge paperwork for testing performed today. Vital signs are listed at the bottom of these instructions.   Medications prescribed:    Take any prescribed medications only as prescribed, and any over the counter medications only as directed on the packaging.  1. You are prescribed Clindamycin, an antibiotic. Please take all of your antibiotics until finished.   You may develop abdominal discomfort or nausea from the antibiotic. If this occurs, you may take it with food. Some patients also get diarrhea with antibiotics. You may help offset this with probiotics which you can buy or get in yogurt. Do not eat or take the probiotics until 2 hours after your antibiotic. Some women develop vaginal yeast infections after antibiotics. If you develop unusual vaginal discharge after being on this medication, please see your primary care provider.   Some people develop allergies to antibiotics. Symptoms of antibiotic allergy can be mild and include a flat rash and itching. They can also be more serious and include:  ?Hives - Hives are raised, red patches of skin that are usually very itchy.  ?Lip or tongue swelling  ?Trouble swallowing or breathing  ?Blistering of the skin or mouth.  If you have any of these serious symptoms, please seek emergency medical care immediately.  2.  You are prescribed chlorhexidine.  This is a medication to swish and spit.  Do not swallow.  3.  You are prescribed viscous lidocaine.  This is a medication to swish and spit every 6 hours as needed.  Do  not swallow.  Home care instructions:  Please follow any educational materials contained in this packet.   Eat a soft or liquid diet and rinse your mouth out after meals with warm water. You should see a dentist or return here at once if you have increased swelling, increased pain or uncontrolled bleeding from the site of your injury.  Follow-up instructions: It is very important that you see a dentist as soon as possible. There is a list of dentists attached to this packet if you do not have care established with a dentist already. Please give a call to a dentist of your choice tomorrow.  Return instructions:  Please return to the Emergency Department if you experience worsening symptoms.  Please seek care if you note any of the following about your dental pain:  You have increased pain not controlled with medicines.  You have swelling around your tooth, in your face or neck.  You have bleeding which starts, continues, or gets worse.  You have a fever >101 If you are unable to open your mouth Please return if you have any other emergent concerns.  Additional Information:   Your vital signs today were: BP 109/68    Pulse 64    Temp 98.5 F (36.9 C)    Resp 18    SpO2 100%  If your blood pressure (BP) was elevated on multiple readings during this visit above 130 for the top number or above 80 for the bottom number, please have this repeated by your primary care provider within one month. --------------  Thank you for allowing [place] to participate in your care today.

## 2018-05-08 ENCOUNTER — Emergency Department (HOSPITAL_COMMUNITY): Payer: Self-pay

## 2018-05-08 ENCOUNTER — Other Ambulatory Visit: Payer: Self-pay

## 2018-05-08 ENCOUNTER — Emergency Department (HOSPITAL_COMMUNITY)
Admission: EM | Admit: 2018-05-08 | Discharge: 2018-05-08 | Disposition: A | Discharge status: Home / Self Care | Payer: Self-pay | Attending: Emergency Medicine | Admitting: Emergency Medicine

## 2018-05-08 DIAGNOSIS — F17210 Nicotine dependence, cigarettes, uncomplicated: Secondary | ICD-10-CM | POA: Insufficient documentation

## 2018-05-08 DIAGNOSIS — Z79899 Other long term (current) drug therapy: Secondary | ICD-10-CM | POA: Insufficient documentation

## 2018-05-08 DIAGNOSIS — L03116 Cellulitis of left lower limb: Secondary | ICD-10-CM | POA: Insufficient documentation

## 2018-05-08 MED ORDER — SODIUM CHLORIDE 0.9 % IV BOLUS
1000.0000 mL | Freq: Once | INTRAVENOUS | Status: AC
  Administered 2018-05-08: 1000 mL via INTRAVENOUS

## 2018-05-08 MED ORDER — IOPAMIDOL (ISOVUE-300) INJECTION 61%
INTRAVENOUS | Status: AC
  Filled 2018-05-08: qty 100

## 2018-05-08 MED ORDER — IOPAMIDOL (ISOVUE-300) INJECTION 61%
100.0000 mL | Freq: Once | INTRAVENOUS | Status: AC | PRN
  Administered 2018-05-08: 100 mL via INTRAVENOUS

## 2018-05-08 MED ORDER — KETOROLAC TROMETHAMINE 30 MG/ML IJ SOLN
30.0000 mg | Freq: Once | INTRAMUSCULAR | Status: AC
  Administered 2018-05-08: 30 mg via INTRAVENOUS
  Filled 2018-05-08: qty 1

## 2018-05-08 MED ORDER — CLINDAMYCIN HCL 300 MG PO CAPS: 300.0000 mg | ORAL_CAPSULE | Freq: Four times a day (QID) | ORAL | 0 refills | Status: AC

## 2018-05-08 NOTE — ED Provider Notes (Signed)
[facility] DEPT Provider Note  CSN: [id number] Arrival date & time: 05/08/18  1511    History   Chief Complaint Chief Complaint  Patient presents with  . Foot Pain    HPI Olivia Mcdaniel is a 37 y.o. female with a medical history of substance use, cholecystitis and ovarian cyst who presented to the ED for left foot pain x2 days. She reports acute onset left foot pain, erythema and swelling. She states that she was walking outside barefoot yesterday and was bitten by a snake. She is unable to describe what the snake looked like or where it bit her. Endorses tingling in left foot, pain with ambulation and bearing weight along with decreased ROM of toes and ankle. Denies fever, chills, fatigue, other arthralgias or skin rash/lesions. Patient has tried nothing prior to coming to the ED.   Past Medical History:  Diagnosis Date  . Gallstones   . Medical history non-contributory   . Ovarian cyst     Patient Active Problem List   Diagnosis Date Noted  . Cocaine abuse with cocaine-induced psychotic disorder, with hallucinations ([place]) 02/05/2018  . Missed periods 12/28/2017  . Left ovarian cyst 12/28/2017  . Cholecystitis 06/17/2015    Past Surgical History:  Procedure Laterality Date  . CHOLECYSTECTOMY  06/17/2015  . CHOLECYSTECTOMY N/A 06/17/2015   Procedure: LAPAROSCOPIC CHOLECYSTECTOMY;  Surgeon: Rolm Bookbinder, MD;  Location: [facility] OR;  Service: General;  Laterality: N/A;     OB History    Gravida  2   Para  2   Term  0   Preterm  2   AB      Living  2     SAB      TAB      Ectopic      Multiple      Live Births  2        Obstetric Comments  SVD at 36 wks. Not in the NICU. Delivered one in [place] and one at [facility] in [place] Medications    Prior to Admission medications   Medication Sig Start Date End Date Taking? Authorizing Provider  albuterol (PROVENTIL HFA;VENTOLIN HFA) 108 (90 BASE)  MCG/ACT inhaler Inhale 2 puffs into the lungs every 6 (six) hours as needed for wheezing. For shortness of breath Patient not taking: Reported on 11/26/2017 10/15/12   Little Ishikawa, MD  chlorhexidine gluconate, MEDLINE KIT, (PERIDEX) 0.12 % solution Use as directed 15 mLs in the mouth or throat 2 (two) times daily. 03/01/18   Langston Masker B, PA-C  clindamycin (CLEOCIN) 300 MG capsule Take 1 capsule (300 mg total) by mouth 4 (four) times daily for 7 days. 05/08/18 05/15/18  Adrienna Karis, Alvie Heidelberg I, PA-C  hydrOXYzine (ATARAX/VISTARIL) 50 MG tablet Take 1 tablet (50 mg total) by mouth at bedtime. 02/05/18   Patrecia Pour, NP  ibuprofen (ADVIL,MOTRIN) 400 MG tablet Take 2 tablets (800 mg total) by mouth every 8 (eight) hours as needed for moderate pain. 03/01/18   Palumbo, April, MD  lidocaine (XYLOCAINE) 2 % solution Use as directed 15 mLs in the mouth or throat as needed for mouth pain. 03/01/18   Langston Masker B, PA-C  naproxen (NAPROSYN) 500 MG tablet Take 1 tablet (500 mg total) by mouth 2 (two) times daily with a meal. 02/14/18   Leftwich-Kirby, Kathie Dike, CNM  norgestimate-ethinyl estradiol (ORTHO-CYCLEN,SPRINTEC,PREVIFEM) 0.25-35 MG-MCG tablet Take 1 tablet by mouth daily. 02/14/18   Leftwich-Kirby,  Lisa A, CNM  sulfamethoxazole-trimethoprim (BACTRIM DS,SEPTRA DS) 800-160 MG tablet Take 1 tablet by mouth 2 (two) times daily. 02/17/18   Laury Deep, CNM    Family History Family History  Problem Relation Age of Onset  . Asthma Mother   . Miscarriages / [place] Mother   . Learning disabilities Sister   . Mental illness Sister   . Mental retardation Sister     Social History Social History   Tobacco Use  . Smoking status: Current Every Day Smoker    Packs/day: 0.50    Types: Cigarettes  . Smokeless tobacco: Never Used  . Tobacco comment: declines  Substance Use Topics  . Alcohol use: Yes    Alcohol/week: 2.0 standard drinks    Types: 2 Glasses of wine per week  . Drug use: Yes    Types:  Cocaine     Allergies   Penicillins and Morphine and related   Review of Systems Review of Systems  Constitutional: Negative for chills, fatigue and fever.  HENT: Negative.   Eyes: Negative.   Respiratory: Negative.   Cardiovascular: Negative.   Gastrointestinal: Negative.   Musculoskeletal: Positive for arthralgias, gait problem and joint swelling.  Skin: Positive for color change.  Neurological: Negative for weakness and numbness.  Hematological: Negative.   Psychiatric/Behavioral: Negative.    Physical Exam Updated Vital Signs BP 97/67 (BP Location: Left Arm)   Pulse 85   Temp 98.6 F (37 C) (Oral)   Resp 18   Wt 52.2 kg   LMP 05/05/2018 (Approximate)   SpO2 98%   BMI 20.37 kg/m   Physical Exam  Constitutional: Vital signs are normal. She appears well-developed and well-nourished. She is cooperative.  Lying in chair with discomfort. Grimaces with foot movements.  Cardiovascular:  Pulses:      Dorsalis pedis pulses are 2+ on the right side, and 2+ on the left side.       Posterior tibial pulses are 2+ on the right side, and 2+ on the left side.  Musculoskeletal:       Left ankle: She exhibits decreased range of motion and swelling. She exhibits normal pulse. Achilles tendon normal.       Left foot: There is decreased range of motion, tenderness and swelling. There is no bony tenderness, normal capillary refill and no deformity.  Left foot and ankle mildly erythematous with blanching. Swollen when compared to the right. Patient endorses pain with ROM. Passive ROM intact, but limits active ROM due to pain. Able to bear weight and ambulate. Remaining lower extremities joints tested and are normal with full active and passive ROM without pain and 5/5 strength.  Feet:  Left Foot:  Skin Integrity: Positive for erythema and warmth.  Neurological: She is alert. She displays no atrophy. No sensory deficit. She exhibits normal muscle tone.  Reflex Scores:      Patellar  reflexes are 2+ on the right side and 2+ on the left side.      Achilles reflexes are 2+ on the right side and 2+ on the left side. Skin: Skin is warm and intact. Capillary refill takes less than 2 seconds. There is erythema.  Right foot erythematous with blanching. No bite marks or punctures wound visualized on foot.  Nursing note and vitals reviewed.  ED Treatments / Results  Labs (all labs ordered are listed, but only abnormal results are displayed) Labs Reviewed - No data to display  EKG None  Radiology Ct Foot Left W Contrast  Result  Date: 05/08/2018 CLINICAL DATA:  The patient was bitten by a snake on the left great toe 05/07/2018. Pain and swelling. Question abscess. EXAM: CT OF THE LOWER LEFT EXTREMITY WITH CONTRAST TECHNIQUE: Multidetector CT imaging of the lower left extremity was performed according to the standard protocol following intravenous contrast administration. COMPARISON:  None. CONTRAST:  100 mL ISOVUE-300 IOPAMIDOL (ISOVUE-300) INJECTION 61% FINDINGS: Bones/Joint/Cartilage No bony or joint abnormality is identified. Ligaments Suboptimally assessed by CT. Muscles and Tendons Intact and normal in appearance. No intramuscular fluid collection. No gas tracking along fascial planes is identified. Soft tissues Subcutaneous edema is seen over the dorsum of the foot. No abscess or foreign body. IMPRESSION: Mild appearing soft tissue swelling over the dorsum of the foot. Negative for abscess. The study is otherwise unremarkable. Electronically Signed   By: Inge Rise M.D.   On: 05/08/2018 16:20    Procedures Procedures (including critical care time)  Medications Ordered in ED Medications  iopamidol (ISOVUE-300) 61 % injection (has no administration in time range)  sodium chloride 0.9 % bolus 1,000 mL (0 mLs Intravenous Stopped 05/08/18 1649)  ketorolac (TORADOL) 30 MG/ML injection 30 mg (30 mg Intravenous Given 05/08/18 1556)  iopamidol (ISOVUE-300) 61 % injection 100 mL  (100 mLs Intravenous Contrast Given 05/08/18 1558)     Initial Impression / Assessment and Plan / ED Course  Triage vital signs and the nursing notes have been reviewed.  Pertinent labs & imaging results that were available during care of the patient were reviewed and considered in medical decision making (see chart for details).  Patient presents for left foot swelling and pain that began acutely yesterday. She is concerned that she was bit by a snake. However on physical exam, there are no bite marks, ulcerations, blisters or areas of drainage to suggest insect or animal bite. Patient's foot is mildly erythematous without streaking and blanches with palpation. When compared to the right, it is more swollen, but it is concerning that patient endorses pain with active ROM and bearing weight. Clinical picture currently consistent with cellulitis, but will order imaging to evaluate for deeper involvement.  Clinical Course as of May 09 1719  Mon May 08, 2018  1648 CT resulted. No evidence of infection infilitrating deeper structures of foot such as muscles, ligaments or bone. No abscess, free fluid or gas visualized. With clinical presentation and physical exam, cellulitis diagnosed confirmed.   [GM]    Clinical Course User Index [GM] Selvin Yun, Jonelle Sports, PA-C   Final Clinical Impressions(s) / ED Diagnoses  1. Cellulitis. Rx for Clindamycin 354m QID x7 days. Education provided on OTC and supportive treatment for pain relief.  Dispo: Home. After thorough clinical evaluation, this patient is determined to be medically stable and can be safely discharged with the previously mentioned treatment and/or outpatient follow-up/referral(s). At this time, there are no other apparent medical conditions that require further screening, evaluation or treatment.   Final diagnoses:  Cellulitis of left lower extremity    ED Discharge Orders         Ordered    clindamycin (CLEOCIN) 300 MG capsule  4 times  daily     05/08/18 1720            Elle Vezina, GBradyI, PA-C 05/08/18 1720    PCharlesetta Shanks MD 05/25/18 1019

## 2018-05-08 NOTE — Discharge Instructions (Addendum)
Your CT looked good. There were no signs that you have an infection or problems with the deep structures of the foot or ankle. I have prescribed you an antibiotic to take for the next 7 days called Clindamycin. Please take this medication for the full 7 days, even if you begin to feel better, to ensure the infection is thoroughly treated.  For pain, you may use Tylenol and/or Ibuprofen. Applying ice to it and keeping it elevated may help as well.  Follow-up with a medical provider if you have one or more of the following symptoms after 2-3 days of taking the antibiotic: fever; increased redness, warmth or tenderness; pain in joints beyond where the initial redness.

## 2018-05-08 NOTE — ED Notes (Signed)
Ace wrap applied to left foot.

## 2018-05-08 NOTE — ED Notes (Signed)
Pt escorted to boyfriends room in ED while he is being seen.

## 2018-05-08 NOTE — ED Notes (Signed)
Patient transported to CT 

## 2018-05-08 NOTE — ED Notes (Signed)
Bed: WTR7 Expected date:  Expected time:  Means of arrival:  Comments: EMS snake bite yesterday

## 2018-05-08 NOTE — ED Notes (Signed)
Pt reporting increased pain. PA made aware

## 2018-05-08 NOTE — ED Triage Notes (Addendum)
Pt arrives via EMS from home. Per pt: Pt was walking on a trail and was bit by a snake yesterday about 3pm. Pt reports that she saw the snake but is unsure of what kind it was. Pt reports the snake bit her on her left great toe. Swelling noted.

## 2018-06-12 ENCOUNTER — Encounter (HOSPITAL_COMMUNITY): Payer: Self-pay | Admitting: *Deleted

## 2018-06-12 ENCOUNTER — Emergency Department (HOSPITAL_COMMUNITY)
Admission: EM | Admit: 2018-06-12 | Discharge: 2018-06-12 | Disposition: A | Discharge status: Home / Self Care | Payer: Self-pay | Attending: Emergency Medicine | Admitting: Emergency Medicine

## 2018-06-12 ENCOUNTER — Emergency Department (HOSPITAL_COMMUNITY): Payer: Self-pay

## 2018-06-12 DIAGNOSIS — R0789 Other chest pain: Secondary | ICD-10-CM | POA: Insufficient documentation

## 2018-06-12 DIAGNOSIS — F1410 Cocaine abuse, uncomplicated: Secondary | ICD-10-CM | POA: Insufficient documentation

## 2018-06-12 DIAGNOSIS — R079 Chest pain, unspecified: Secondary | ICD-10-CM

## 2018-06-12 DIAGNOSIS — F17210 Nicotine dependence, cigarettes, uncomplicated: Secondary | ICD-10-CM | POA: Insufficient documentation

## 2018-06-12 DIAGNOSIS — Z79899 Other long term (current) drug therapy: Secondary | ICD-10-CM | POA: Insufficient documentation

## 2018-06-12 DIAGNOSIS — F1910 Other psychoactive substance abuse, uncomplicated: Secondary | ICD-10-CM

## 2018-06-12 DIAGNOSIS — R05 Cough: Secondary | ICD-10-CM | POA: Insufficient documentation

## 2018-06-12 DIAGNOSIS — R202 Paresthesia of skin: Secondary | ICD-10-CM | POA: Insufficient documentation

## 2018-06-12 LAB — BASIC METABOLIC PANEL
ANION GAP: 8 (ref 5–15)
BUN: 5 mg/dL — ABNORMAL LOW (ref 6–20)
CALCIUM: 7.6 mg/dL — AB (ref 8.9–10.3)
CO2: 23 mmol/L (ref 22–32)
CREATININE: 0.48 mg/dL (ref 0.44–1.00)
Chloride: 110 mmol/L (ref 98–111)
GLUCOSE: 79 mg/dL (ref 70–99)
Potassium: 3.2 mmol/L — ABNORMAL LOW (ref 3.5–5.1)
Sodium: 141 mmol/L (ref 135–145)

## 2018-06-12 LAB — CBC
HCT: 35.6 % — ABNORMAL LOW (ref 36.0–46.0)
Hemoglobin: 11.8 g/dL — ABNORMAL LOW (ref 12.0–15.0)
MCH: 31.8 pg (ref 26.0–34.0)
MCHC: 33.1 g/dL (ref 30.0–36.0)
MCV: 96 fL (ref 78.0–100.0)
PLATELETS: 457 10*3/uL — AB (ref 150–400)
RBC: 3.71 MIL/uL — ABNORMAL LOW (ref 3.87–5.11)
RDW: 14.6 % (ref 11.5–15.5)
WBC: 10.5 10*3/uL (ref 4.0–10.5)

## 2018-06-12 LAB — HEPATIC FUNCTION PANEL
ALK PHOS: 65 U/L (ref 38–126)
ALT: 8 U/L (ref 0–44)
AST: 11 U/L — ABNORMAL LOW (ref 15–41)
Albumin: 2.8 g/dL — ABNORMAL LOW (ref 3.5–5.0)
Total Bilirubin: 0.5 mg/dL (ref 0.3–1.2)
Total Protein: 5.7 g/dL — ABNORMAL LOW (ref 6.5–8.1)

## 2018-06-12 LAB — I-STAT BETA HCG BLOOD, ED (MC, WL, AP ONLY): I-stat hCG, quantitative: <5 m[IU]/mL (ref <5)

## 2018-06-12 LAB — LIPASE, BLOOD: LIPASE: 27 U/L (ref 11–51)

## 2018-06-12 LAB — I-STAT TROPONIN, ED: TROPONIN I, POC: 0 ng/mL (ref 0.00–0.08)

## 2018-06-12 MED ORDER — HYDROXYZINE HCL 25 MG PO TABS: 25.0000 mg | ORAL_TABLET | Freq: Four times a day (QID) | ORAL | 0 refills | Status: AC | PRN

## 2018-06-12 MED ORDER — KETOROLAC TROMETHAMINE 30 MG/ML IJ SOLN
30.0000 mg | Freq: Once | INTRAMUSCULAR | Status: AC
  Administered 2018-06-12: 30 mg via INTRAVENOUS
  Filled 2018-06-12: qty 1

## 2018-06-12 MED ORDER — HYDROXYZINE HCL 25 MG PO TABS
25.0000 mg | ORAL_TABLET | Freq: Once | ORAL | Status: AC
  Administered 2018-06-12: 25 mg via ORAL
  Filled 2018-06-12: qty 1
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
## 2018-06-12 NOTE — ED Notes (Signed)
Patient removed wires and refused discharge vitals
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
## 2018-06-12 NOTE — Discharge Instructions (Signed)
Follow-up with the resources stated in the discharge instructions, you can take the Atarax as needed for anxiety and insomnia

## 2018-06-12 NOTE — ED Notes (Signed)
Bed: WA09 Expected date:  Expected time:  Means of arrival:  Comments: Chest pain 

## 2018-06-12 NOTE — ED Notes (Signed)
Pt eating ravioli and drinking sprite

## 2018-07-01 ENCOUNTER — Other Ambulatory Visit: Payer: Self-pay

## 2018-07-01 ENCOUNTER — Emergency Department (HOSPITAL_COMMUNITY)
Admission: EM | Admit: 2018-07-01 | Discharge: 2018-07-01 | Disposition: A | Discharge status: Home / Self Care | Payer: Self-pay | Attending: Emergency Medicine | Admitting: Emergency Medicine

## 2018-07-01 ENCOUNTER — Encounter (HOSPITAL_COMMUNITY): Payer: Self-pay

## 2018-07-01 DIAGNOSIS — R45851 Suicidal ideations: Secondary | ICD-10-CM | POA: Insufficient documentation

## 2018-07-01 DIAGNOSIS — F14151 Cocaine abuse with cocaine-induced psychotic disorder with hallucinations: Secondary | ICD-10-CM | POA: Insufficient documentation

## 2018-07-01 DIAGNOSIS — F29 Unspecified psychosis not due to a substance or known physiological condition: Secondary | ICD-10-CM | POA: Insufficient documentation

## 2018-07-01 DIAGNOSIS — Z79899 Other long term (current) drug therapy: Secondary | ICD-10-CM | POA: Insufficient documentation

## 2018-07-01 DIAGNOSIS — F1910 Other psychoactive substance abuse, uncomplicated: Secondary | ICD-10-CM | POA: Diagnosis present

## 2018-07-01 DIAGNOSIS — F1994 Other psychoactive substance use, unspecified with psychoactive substance-induced mood disorder: Secondary | ICD-10-CM | POA: Diagnosis present

## 2018-07-01 DIAGNOSIS — F17210 Nicotine dependence, cigarettes, uncomplicated: Secondary | ICD-10-CM | POA: Insufficient documentation

## 2018-07-01 LAB — RAPID URINE DRUG SCREEN, HOSP PERFORMED
Amphetamines: POSITIVE — AB
BARBITURATES: NOT DETECTED
Benzodiazepines: NOT DETECTED
Cocaine: POSITIVE — AB
Opiates: NOT DETECTED
Tetrahydrocannabinol: NOT DETECTED

## 2018-07-01 LAB — CBC
HEMATOCRIT: 42.7 % (ref 36.0–46.0)
Hemoglobin: 13.6 g/dL (ref 12.0–15.0)
MCH: 31.5 pg (ref 26.0–34.0)
MCHC: 31.9 g/dL (ref 30.0–36.0)
MCV: 98.8 fL (ref 80.0–100.0)
PLATELETS: 401 10*3/uL — AB (ref 150–400)
RBC: 4.32 MIL/uL (ref 3.87–5.11)
RDW: 13.7 % (ref 11.5–15.5)
WBC: 16.7 10*3/uL — ABNORMAL HIGH (ref 4.0–10.5)
nRBC: 0 % (ref 0.0–0.2)

## 2018-07-01 LAB — COMPREHENSIVE METABOLIC PANEL
ALT: 8 U/L (ref 0–44)
AST: 14 U/L — ABNORMAL LOW (ref 15–41)
Albumin: 3.2 g/dL — ABNORMAL LOW (ref 3.5–5.0)
Alkaline Phosphatase: 68 U/L (ref 38–126)
Anion gap: 5 (ref 5–15)
BILIRUBIN TOTAL: 0.2 mg/dL — AB (ref 0.3–1.2)
BUN: 7 mg/dL (ref 6–20)
CHLORIDE: 104 mmol/L (ref 98–111)
CO2: 25 mmol/L (ref 22–32)
CREATININE: 0.8 mg/dL (ref 0.44–1.00)
Calcium: 8 mg/dL — ABNORMAL LOW (ref 8.9–10.3)
Glucose, Bld: 100 mg/dL — ABNORMAL HIGH (ref 70–99)
Potassium: 3.5 mmol/L (ref 3.5–5.1)
Sodium: 134 mmol/L — ABNORMAL LOW (ref 135–145)
TOTAL PROTEIN: 6.3 g/dL — AB (ref 6.5–8.1)

## 2018-07-01 LAB — SALICYLATE LEVEL

## 2018-07-01 LAB — ACETAMINOPHEN LEVEL: Acetaminophen (Tylenol), Serum: <10 ug/mL — ABNORMAL LOW (ref 10–30)

## 2018-07-01 LAB — ETHANOL

## 2018-07-01 LAB — I-STAT BETA HCG BLOOD, ED (MC, WL, AP ONLY)

## 2018-07-01 MED ORDER — ONDANSETRON HCL 4 MG PO TABS: 4.0000 mg | ORAL_TABLET | Freq: Three times a day (TID) | ORAL | Status: DC | PRN

## 2018-07-01 MED ORDER — NICOTINE 21 MG/24HR TD PT24
21.0000 mg | MEDICATED_PATCH | Freq: Every day | TRANSDERMAL | Status: DC
  Administered 2018-07-01: 21 mg via TRANSDERMAL
  Filled 2018-07-01: qty 1

## 2018-07-01 MED ORDER — LORAZEPAM 1 MG PO TABS
1.0000 mg | ORAL_TABLET | Freq: Once | ORAL | Status: AC
  Administered 2018-07-01: 1 mg via ORAL
  Filled 2018-07-01: qty 1

## 2018-07-01 MED ORDER — IBUPROFEN 200 MG PO TABS: 600.0000 mg | ORAL_TABLET | Freq: Three times a day (TID) | ORAL | Status: DC | PRN

## 2018-07-01 NOTE — ED Notes (Signed)
Pt d/c home per MD order. Discharge summary reviewed with pt. Pt denies SI/HI/AVH. Personal property returned. Pt signed e-signature. Ambulatory off unit with MHT.

## 2018-07-01 NOTE — BH Assessment (Addendum)
Assessment Note  Olivia Mcdaniel is an 37 y.o. female, who presents voluntary and unaccompanied to [facility]. Clinician asked the pt, "what brought you to the hospital?" Pt reported, feeling suicidal and wanting to jump in front of a vehicle. Pt reported, the following stressors: loosing custody of her children to the state in 2009, loss of finances, substance use, homelessness. Pt reported, her stressors increases her depressive symptoms. Pt reported, having minimal natural supports which increases her depressive symptoms. Pt reported, wanting to kill her ex-boyfriend by beating him.  Pt denies, AVH, access to weapons and self-injurious behaviors.   Pt denies abuse. Pt reported, using two bumps of crystal meth, today.  Pt reported, "half a 20," today. Pt's UDS is positive for cocaine. Pt denies, being linked to OPT resources (medication management and/or counseling.) Pt denies, previous inpatient admissions.   Pt presents disheveled, in scrubs with logical/coherent speech. Pt;s mood was depressed, helpless. Pt's affect was depressed. Pt's thought process was coherent/relevant. Pt's judgement was partial. Pt was oriented x4. Pt's concentration was normal. Pt's insight was fair. Pt's impulse control was fair. Pt reported, if discharged from [facility] she could not contract for safety. Pt reported, if inpatient treatment was recommended she would sign-in voluntarily.   Diagnosis: F33.2 Major Depressive Disorder, recurrent, severe without psychosis.                     F14.20 Cocaine use Disorder, severe.   Past Medical History:  Past Medical History:  Diagnosis Date  . Gallstones   . Medical history non-contributory   . Ovarian cyst     Past Surgical History:  Procedure Laterality Date  . CHOLECYSTECTOMY  06/17/2015  . CHOLECYSTECTOMY N/A 06/17/2015   Procedure: LAPAROSCOPIC CHOLECYSTECTOMY;  Surgeon: Emelia Loron, MD;  Location: [facility] OR;  Service: General;  Laterality: N/A;    Family History:   Family History  Problem Relation Age of Onset  . Asthma Mother   . Miscarriages / [place] Mother   . Learning disabilities Sister   . Mental illness Sister   . Mental retardation Sister     Social History:  reports that she has been smoking cigarettes. She has been smoking about 0.50 packs per day. She has never used smokeless tobacco. She reports that she drinks about 2.0 standard drinks of alcohol per week. She reports that she has current or past drug history. Drug: Cocaine.  Additional Social History:  Alcohol / Drug Use Pain Medications: See MAR Prescriptions: See MAR Over the Counter: See MAR History of alcohol / drug use?: Yes Negative Consequences of Use: Financial Substance #1 Name of Substance 1: Crystal Meth. 1 - Age of First Use: 34 1 - Amount (size/oz): Pt reported, using two bumps of crystal meth, today.  1 - Frequency: Pt reported, when she can afford it. 1 - Duration: Ongoing.  1 - Last Use / Amount: Today.  Substance #2 Name of Substance 2: Cocaine  2 - Age of First Use: 29 2 - Amount (size/oz): Pt reported, "half a 20."  2 - Frequency: Pt reported, when she can afford it. 2 - Duration: Ongoing. 2 - Last Use / Amount: Today.   CIWA:   COWS:    Allergies:  Allergies  Allergen Reactions  . Penicillins     Unsure  Has patient had a PCN reaction causing immediate rash, facial/tongue/throat swelling, SOB or lightheadedness with hypotension: Yes Has patient had a PCN reaction causing severe rash involving mucus membranes or skin necrosis:  No Has patient had a PCN reaction that required hospitalization: No Has patient had a PCN reaction occurring within the last 10 years: Yes If all of the above answers are "NO", then may proceed with Cephalosporin use.    Marland Kitchen Morphine And Related Rash    Home Medications:  (Not in a hospital admission)  OB/GYN Status:  No LMP recorded. (Menstrual status: Irregular Periods).  General Assessment Data Location of  Assessment: [facility] ED TTS Assessment: In system Is this a Tele or Face-to-Face Assessment?: Face-to-Face Is this an Initial Assessment or a Re-assessment for this encounter?: Initial Assessment Patient Accompanied by:: N/A Language Other than English: No Living Arrangements: Homeless/Shelter What gender do you identify as?: Female Marital status: Single Living Arrangements: Other (Comment)(Homeless. ) Can pt return to current living arrangement?: Yes Admission Status: Voluntary Is patient capable of signing voluntary admission?: Yes Referral Source: Self/Family/Friend Insurance type: Self-pay.      Crisis Care Plan Living Arrangements: Other (Comment)(Homeless. ) Legal Guardian: Other:(Self. ) Name of Psychiatrist: NA Name of Therapist: NA  Education Status Is patient currently in school?: No Is the patient employed, unemployed or receiving disability?: Unemployed  Risk to self with the past 6 months Suicidal Ideation: Yes-Currently Present Has patient been a risk to self within the past 6 months prior to admission? : Yes Suicidal Intent: Yes-Currently Present Has patient had any suicidal intent within the past 6 months prior to admission? : Yes Is patient at risk for suicide?: Yes Suicidal Plan?: Yes-Currently Present Has patient had any suicidal plan within the past 6 months prior to admission? : Yes Specify Current Suicidal Plan: To jump in front of a vehicle. Access to Means: Yes Specify Access to Suicidal Means: Pt has access to roads.  What has been your use of drugs/alcohol within the last 12 months?: Cocaine and crystal meth.  Previous Attempts/Gestures: No How many times?: 0 Other Self Harm Risks: NA Triggers for Past Attempts: None known Intentional Self Injurious Behavior: None Family Suicide History: No Recent stressful life event(s): Financial Problems, Conflict (Comment), Other (Comment)(Lost custody of kids, drug use, homelessness, family conflic) Persecutory  voices/beliefs?: No Depression: Yes Depression Symptoms: Despondent, Insomnia, Loss of interest in usual pleasures, Feeling worthless/self pity, Feeling angry/irritable, Guilt Substance abuse history and/or treatment for substance abuse?: Yes Suicide prevention information given to non-admitted patients: Not applicable  Risk to Others within the past 6 months Homicidal Ideation: Yes-Currently Present Does patient have any lifetime risk of violence toward others beyond the six months prior to admission? : No Thoughts of Harm to Others: Yes-Currently Present Comment - Thoughts of Harm to Others: Pt reported, wanting to kill her ex-boyfriend.  Current Homicidal Intent: Yes-Currently Present Current Homicidal Plan: Yes-Currently Present Describe Current Homicidal Plan: Beat him Access to Homicidal Means: No Identified Victim: Ex-boyfriend. History of harm to others?: No Assessment of Violence: None Noted Violent Behavior Description: NA Does patient have access to weapons?: No(Pt denies. ) Criminal Charges Pending?: Yes Describe Pending Criminal Charges: Pt forgot her charge.  Does patient have a court date: Yes Court Date: 07/18/18 Is patient on probation?: No  Psychosis Hallucinations: None noted Delusions: None noted  Mental Status Report Appearance/Hygiene: In scrubs, Disheveled Eye Contact: Good Motor Activity: Unremarkable Speech: Logical/coherent Level of Consciousness: Quiet/awake Mood: Depressed, Helpless Affect: Depressed Anxiety Level: Moderate Thought Processes: Coherent, Relevant Judgement: Partial Orientation: Person, Place, Time, Situation Obsessive Compulsive Thoughts/Behaviors: Minimal  Cognitive Functioning Concentration: Normal Memory: Recent Intact Is patient IDD: No Insight: Fair Impulse Control:  Poor Appetite: Fair Have you had any weight changes? : No Change Sleep: Decreased Total Hours of Sleep: 0 Vegetative Symptoms: None  ADLScreening [facility]  Assessment Services) Patient's cognitive ability adequate to safely complete daily activities?: Yes Patient able to express need for assistance with ADLs?: Yes Independently performs ADLs?: Yes (appropriate for developmental age)  Prior Inpatient Therapy Prior Inpatient Therapy: No  Prior Outpatient Therapy Prior Outpatient Therapy: No Does patient have an ACCT team?: No Does patient have Intensive In-House Services?  : No Does patient have [facility] services? : No Does patient have P4CC services?: No  ADL Screening (condition at time of admission) Patient's cognitive ability adequate to safely complete daily activities?: Yes Is the patient deaf or have difficulty hearing?: No Does the patient have difficulty seeing, even when wearing glasses/contacts?: No Does the patient have difficulty concentrating, remembering, or making decisions?: Yes Patient able to express need for assistance with ADLs?: Yes Does the patient have difficulty dressing or bathing?: No Independently performs ADLs?: Yes (appropriate for developmental age) Does the patient have difficulty walking or climbing stairs?: No Weakness of Legs: Both(Numbness in leg. ) Weakness of Arms/Hands: Both(Numbness in arms. )  Home Assistive Devices/Equipment Home Assistive Devices/Equipment: None    Abuse/Neglect Assessment (Assessment to be complete while patient is alone) Abuse/Neglect Assessment Can Be Completed: Yes Physical Abuse: Denies(Pt denies. ) Verbal Abuse: Denies(Pt denies. ) Sexual Abuse: Denies(Pt denies. ) Exploitation of patient/patient's resources: Denies(Pt denies. ) Self-Neglect: Denies(Pt denies. )     Advance Directives (For Healthcare) Does Patient Have a Medical Advance Directive?: No Would patient like information on creating a medical advance directive?: No - Patient declined          Disposition: Donell Sievert, PA recommends inpatient treatment. Disposition discussed with Dahlia Client, PA and  Zella Ball, RN. TTS to seek placement.   Disposition Initial Assessment Completed for this Encounter: Yes  On Site Evaluation by: Redmond Pulling, MS, LPC, CRC. Reviewed with Physician: Dahlia Client, [place] and Donell Sievert, PA   Redmond Pulling 07/01/2018 2:54 AM   Redmond Pulling, MS, [facility], [facility] Triage Specialist [phone]

## 2018-07-01 NOTE — ED Notes (Signed)
Pt alert and oriented, pt denies any si, hi,pain, and avh. Pt answered all questions appropriately. Pt currently resting in bed, will continue to monitor.

## 2018-07-01 NOTE — ED Triage Notes (Signed)
Pt reports SI for the last 3 days. She endorses doing "half a twenty of crystal and half a twenty of cocaine." She has a plan to run out in traffic. She states that she feels worthless. A&Ox4. Ambulatory. Denies alcohol use.

## 2018-07-01 NOTE — BHH Suicide Risk Assessment (Signed)
Suicide Risk Assessment  Discharge Assessment   [facility] Discharge Suicide Risk Assessment   Principal Problem: Substance induced mood disorder [facility]) Discharge Diagnoses:  Patient Active Problem List   Diagnosis Date Noted  . Polysubstance abuse (HCC) [F19.10] 07/01/2018    Priority: High  . Substance induced mood disorder (HCC) [F19.94] 07/01/2018    Priority: High  . Missed periods [N92.6] 12/28/2017  . Left ovarian cyst [N83.202] 12/28/2017  . Cholecystitis [K81.9] 06/17/2015    Total Time spent with patient: 45 minutes  Musculoskeletal: Strength & Muscle Tone: within normal limits Gait & Station: normal Patient leans: N/A  Psychiatric Specialty Exam:   Blood pressure 114/72, pulse (!) 124, temperature 98 F (36.7 C), temperature source Oral, resp. rate 16, SpO2 100 %.There is no height or weight on file to calculate BMI.  General Appearance: Disheveled  Eye Contact::  Good  Speech:  Normal Rate409  Volume:  Normal  Mood:  Euthymic  Affect:  Congruent  Thought Process:  Coherent and Descriptions of Associations: Intact  Orientation:  Full (Time, Place, and Person)  Thought Content:  WDL and Logical  Suicidal Thoughts:  No  Homicidal Thoughts:  No  Memory:  Immediate;   Good Recent;   Good Remote;   Good  Judgement:  Fair  Insight:  Fair  Psychomotor Activity:  Normal  Concentration:  Good  Recall:  Good  Fund of Knowledge:Fair  Language: Good  Akathisia:  No  Handed:  Right  AIMS (if indicated):     Assets:  Leisure Time  Sleep:     Cognition: WNL  ADL's:  Intact   Mental Status Per Nursing Assessment::   On Admission:   37 yo female who came to the ED with her boyfriend after using meth and cocaine.  Today, she denies suicidal/homicidal ideations, hallucinations, or withdrawal symptoms.  Wants to meet with peer support for resources for rehab/recovery. Stable for discharge.  Demographic Factors:  Caucasian  Loss Factors: NA  Historical  Factors: NA  Risk Reduction Factors:   Living with another person, especially a relative and Positive social support  Continued Clinical Symptoms:  None   Cognitive Features That Contribute To Risk:  None    Suicide Risk:  Minimal: No identifiable suicidal ideation.  Patients presenting with no risk factors but with morbid ruminations; may be classified as minimal risk based on the severity of the depressive symptoms    Plan Of Care/Follow-up recommendations:  Activity:  as tolerated Diet:  heart healthy diet  LORD, JAMISON, NP 07/01/2018, 10:44 AM

## 2018-07-01 NOTE — ED Provider Notes (Signed)
[facility]-EMERGENCY DEPT Provider Note   CSN: [id number] Arrival date & time: 07/01/18  0056     History   Chief Complaint Chief Complaint  Patient presents with  . Suicidal    HPI Olivia Mcdaniel is a 37 y.o. female with a hx of gallstones, ovarian cyst presents to the Emergency Department complaining of gradual, persistent, progressively worsening suicidal ideation onset last night after not having a place to stay. Pt reports a plan to be hit by a car.  Associated symptoms include felling worthless and helpless.  Pt reports she lost her children in 2009 due to her drug usage.  Nothing makes her symptoms better.  Pt reports she is having trouble sleeping and feel defeated. Pt denies self harm today or previously.  Pt reports she did cocaine and crystal meth this morning.  She denies fever, chills, headache, neck pain, chest pain, SOB, abd pain, N/V/D, weakness, syncope, dysuria.  Pt reports she smokes 1 ppd of cigarettes.  She reports no alcohol usage.  She reports her last drug usage was this morning and again this afternoon.     The history is provided by the patient and medical records. No language interpreter was used.    Past Medical History:  Diagnosis Date  . Gallstones   . Medical history non-contributory   . Ovarian cyst     Patient Active Problem List   Diagnosis Date Noted  . Cocaine abuse with cocaine-induced psychotic disorder, with hallucinations (HCC) 02/05/2018  . Missed periods 12/28/2017  . Left ovarian cyst 12/28/2017  . Cholecystitis 06/17/2015    Past Surgical History:  Procedure Laterality Date  . CHOLECYSTECTOMY  06/17/2015  . CHOLECYSTECTOMY N/A 06/17/2015   Procedure: LAPAROSCOPIC CHOLECYSTECTOMY;  Surgeon: Emelia Loron, MD;  Location: [facility] OR;  Service: General;  Laterality: N/A;     OB History    Gravida  2   Para  2   Term  0   Preterm  2   AB      Living  2     SAB      TAB      Ectopic      Multiple      Live Births  2        Obstetric Comments  SVD at 36 wks. Not in the NICU. Delivered one in [place] and one at [facility] in [place]         Home Medications    Prior to Admission medications   Medication Sig Start Date End Date Taking? Authorizing Provider  naproxen sodium (ALEVE) 220 MG tablet Take 440 mg by mouth 2 (two) times daily as needed (pain).   Yes [provider]  albuterol (PROVENTIL HFA;VENTOLIN HFA) 108 (90 BASE) MCG/ACT inhaler Inhale 2 puffs into the lungs every 6 (six) hours as needed for wheezing. For shortness of breath Patient not taking: Reported on 11/26/2017 10/15/12   Johnnette Gourd, MD  clindamycin (CLEOCIN) 300 MG capsule Take 300 mg by mouth 4 (four) times daily. Take 1 capsule by mouth four times daily for seven days. 05/21/18   [provider]  sulfamethoxazole-trimethoprim (BACTRIM DS,SEPTRA DS) 800-160 MG tablet Take 1 tablet by mouth 2 (two) times daily. Patient not taking: Reported on 06/12/2018 02/17/18   Raelyn Mora, CNM    Family History Family History  Problem Relation Age of Onset  . Asthma Mother   . Miscarriages / [place] Mother   . Learning disabilities Sister   . Mental  illness Sister   . Mental retardation Sister     Social History Social History   Tobacco Use  . Smoking status: Current Every Day Smoker    Packs/day: 0.50    Types: Cigarettes  . Smokeless tobacco: Never Used  . Tobacco comment: declines  Substance Use Topics  . Alcohol use: Yes    Alcohol/week: 2.0 standard drinks    Types: 2 Glasses of wine per week  . Drug use: Yes    Types: Cocaine     Allergies   Penicillins and Morphine and related   Review of Systems Review of Systems  Constitutional: Negative for appetite change, diaphoresis, fatigue, fever and unexpected weight change.  HENT: Negative for mouth sores.   Eyes: Negative for visual disturbance.  Respiratory: Negative for cough, chest tightness, shortness of  breath and wheezing.   Cardiovascular: Negative for chest pain.  Gastrointestinal: Negative for abdominal pain, constipation, diarrhea, nausea and vomiting.  Endocrine: Negative for polydipsia, polyphagia and polyuria.  Genitourinary: Negative for dysuria, frequency, hematuria and urgency.  Musculoskeletal: Negative for back pain and neck stiffness.  Skin: Negative for rash.  Allergic/Immunologic: Negative for immunocompromised state.  Neurological: Negative for syncope, light-headedness and headaches.  Hematological: Does not bruise/bleed easily.  Psychiatric/Behavioral: Positive for suicidal ideas. Negative for sleep disturbance. The patient is not nervous/anxious.      Physical Exam Updated Vital Signs BP 104/77 (BP Location: Left Arm)   Pulse 72   Temp 97.9 F (36.6 C) (Oral)   Resp 14   SpO2 100%   Physical Exam  Constitutional: She appears well-developed and well-nourished. No distress.  HENT:  Head: Normocephalic.  Eyes: Conjunctivae are normal. No scleral icterus.  Neck: Normal range of motion.  Cardiovascular: Normal rate and intact distal pulses.  Pulmonary/Chest: Effort normal and breath sounds normal. No respiratory distress.  Abdominal: Soft. She exhibits no distension. There is no tenderness.  Musculoskeletal: Normal range of motion.  Neurological: She is alert.  Skin: Skin is warm and dry.  Psychiatric: Her speech is normal and behavior is normal. Her mood appears anxious. She is not actively hallucinating. She exhibits a depressed mood. She expresses suicidal ideation. She expresses no homicidal ideation. She expresses suicidal plans. She expresses no homicidal plans.  Nursing note and vitals reviewed.    ED Treatments / Results  Labs (all labs ordered are listed, but only abnormal results are displayed) Labs Reviewed  COMPREHENSIVE METABOLIC PANEL - Abnormal; Notable for the following components:      Result Value   Sodium 134 (*)    Glucose, Bld 100  (*)    Calcium 8.0 (*)    Total Protein 6.3 (*)    Albumin 3.2 (*)    AST 14 (*)    Total Bilirubin 0.2 (*)    All other components within normal limits  ACETAMINOPHEN LEVEL - Abnormal; Notable for the following components:   Acetaminophen (Tylenol), Serum <10 (*)    All other components within normal limits  CBC - Abnormal; Notable for the following components:   WBC 16.7 (*)    Platelets 401 (*)    All other components within normal limits  RAPID URINE DRUG SCREEN, HOSP PERFORMED - Abnormal; Notable for the following components:   Cocaine POSITIVE (*)    Amphetamines POSITIVE (*)    All other components within normal limits  ETHANOL  SALICYLATE LEVEL  I-STAT BETA HCG BLOOD, ED ([facility], [facility], [facility] ONLY)    Procedures Procedures (including critical care time)  Medications Ordered in ED Medications  ibuprofen (ADVIL,MOTRIN) tablet 600 mg (has no administration in time range)  ondansetron (ZOFRAN) tablet 4 mg (has no administration in time range)  nicotine (NICODERM CQ - dosed in mg/24 hours) patch 21 mg (has no administration in time range)  LORazepam (ATIVAN) tablet 1 mg (has no administration in time range)     Initial Impression / Assessment and Plan / ED Course  I have reviewed the triage vital signs and the nursing notes.  Pertinent labs & imaging results that were available during my care of the patient were reviewed by me and considered in my medical decision making (see chart for details).  Clinical Course as of Jul 02 435  Sat Jul 01, 2018  0238 Pt meets inpatient criteria per TTS.   [HM]  0403 Elevated, no fever or infectious symptoms  WBC(!): 16.7 [HM]  0403 noted  COCAINE(!): POSITIVE [HM]  0403 noted  Amphetamines(!): POSITIVE [HM]    Clinical Course User Index [HM] Taelyn Broecker, Dahlia Client, PA-C    Presents with suicidal ideation with a plan.  Additionally she has a history of polysubstance abuse and has been using today.  Labs show leukocytosis however  patient is without fever or infectious symptoms.  No urinary symptoms.  Patient denies IV drug use.  At this time there is no emergent medical condition which requires intervention.  Patient was evaluated by TTS who recommends that she meets inpatient criteria.   Final Clinical Impressions(s) / ED Diagnoses   Final diagnoses:  Suicidal ideation  Polysubstance abuse [facility])    ED Discharge Orders    None       Mardene Sayer Boyd Kerbs 07/01/18 0436    Molpus, Jonny Ruiz, MD 07/01/18 [phone]

## 2018-07-01 NOTE — ED Notes (Signed)
Cell phone charger [employer] fatigue coat [place]  Lighter Cell phone Black shoes(sketchers) [employer]let [place] identification Black hooded sweatshirt ([place] panthers) Civil engineer, contracting with belt Black socks

## 2018-07-15 ENCOUNTER — Emergency Department (HOSPITAL_COMMUNITY): Payer: Self-pay

## 2018-07-15 ENCOUNTER — Other Ambulatory Visit: Payer: Self-pay

## 2018-07-15 ENCOUNTER — Encounter (HOSPITAL_COMMUNITY): Payer: Self-pay | Admitting: *Deleted

## 2018-07-15 ENCOUNTER — Emergency Department (HOSPITAL_COMMUNITY)
Admission: EM | Admit: 2018-07-15 | Discharge: 2018-07-15 | Disposition: A | Discharge status: Home / Self Care | Payer: Self-pay | Attending: Emergency Medicine | Admitting: Emergency Medicine

## 2018-07-15 DIAGNOSIS — Z79899 Other long term (current) drug therapy: Secondary | ICD-10-CM | POA: Insufficient documentation

## 2018-07-15 DIAGNOSIS — R109 Unspecified abdominal pain: Secondary | ICD-10-CM

## 2018-07-15 DIAGNOSIS — F17210 Nicotine dependence, cigarettes, uncomplicated: Secondary | ICD-10-CM | POA: Insufficient documentation

## 2018-07-15 DIAGNOSIS — R102 Pelvic and perineal pain: Secondary | ICD-10-CM | POA: Insufficient documentation

## 2018-07-15 LAB — COMPREHENSIVE METABOLIC PANEL
ALT: 9 U/L (ref 0–44)
ANION GAP: 5 (ref 5–15)
AST: 15 U/L (ref 15–41)
Albumin: 3.1 g/dL — ABNORMAL LOW (ref 3.5–5.0)
Alkaline Phosphatase: 69 U/L (ref 38–126)
BUN: 5 mg/dL — ABNORMAL LOW (ref 6–20)
CHLORIDE: 112 mmol/L — AB (ref 98–111)
CO2: 22 mmol/L (ref 22–32)
Calcium: 8.7 mg/dL — ABNORMAL LOW (ref 8.9–10.3)
Creatinine, Ser: 0.68 mg/dL (ref 0.44–1.00)
GFR calc Af Amer: >60 mL/min (ref >60)
Glucose, Bld: 123 mg/dL — ABNORMAL HIGH (ref 70–99)
POTASSIUM: 3.6 mmol/L (ref 3.5–5.1)
Sodium: 139 mmol/L (ref 135–145)
Total Bilirubin: 0.4 mg/dL (ref 0.3–1.2)
Total Protein: 6.1 g/dL — ABNORMAL LOW (ref 6.5–8.1)

## 2018-07-15 LAB — URINALYSIS, ROUTINE W REFLEX MICROSCOPIC
Bilirubin Urine: NEGATIVE
Glucose, UA: NEGATIVE mg/dL
Hgb urine dipstick: NEGATIVE
KETONES UR: NEGATIVE mg/dL
LEUKOCYTES UA: NEGATIVE
NITRITE: NEGATIVE
Protein, ur: NEGATIVE mg/dL
Specific Gravity, Urine: 1.044 — ABNORMAL HIGH (ref 1.005–1.030)
pH: 7 (ref 5.0–8.0)

## 2018-07-15 LAB — CBC WITH DIFFERENTIAL/PLATELET
Abs Immature Granulocytes: 0.02 10*3/uL (ref 0.00–0.07)
BASOS ABS: 0.1 10*3/uL (ref 0.0–0.1)
BASOS PCT: 1 %
EOS ABS: 0.3 10*3/uL (ref 0.0–0.5)
Eosinophils Relative: 3 %
HCT: 38.2 % (ref 36.0–46.0)
Hemoglobin: 12.4 g/dL (ref 12.0–15.0)
IMMATURE GRANULOCYTES: 0 %
Lymphocytes Relative: 27 %
Lymphs Abs: 2.5 10*3/uL (ref 0.7–4.0)
MCH: 31.3 pg (ref 26.0–34.0)
MCHC: 32.5 g/dL (ref 30.0–36.0)
MCV: 96.5 fL (ref 80.0–100.0)
MONOS PCT: 9 %
Monocytes Absolute: 0.8 10*3/uL (ref 0.1–1.0)
NEUTROS PCT: 60 %
NRBC: 0 % (ref 0.0–0.2)
Neutro Abs: 5.7 10*3/uL (ref 1.7–7.7)
PLATELETS: 383 10*3/uL (ref 150–400)
RBC: 3.96 MIL/uL (ref 3.87–5.11)
RDW: 13.2 % (ref 11.5–15.5)
WBC: 9.4 10*3/uL (ref 4.0–10.5)

## 2018-07-15 LAB — I-STAT BETA HCG BLOOD, ED (MC, WL, AP ONLY)

## 2018-07-15 MED ORDER — SODIUM CHLORIDE 0.9 % IV BOLUS
1000.0000 mL | Freq: Once | INTRAVENOUS | Status: AC
  Administered 2018-07-15: 1000 mL via INTRAVENOUS

## 2018-07-15 MED ORDER — IOHEXOL 300 MG/ML  SOLN
100.0000 mL | Freq: Once | INTRAMUSCULAR | Status: AC | PRN
  Administered 2018-07-15: 100 mL via INTRAVENOUS

## 2018-07-15 MED ORDER — ACETAMINOPHEN 325 MG PO TABS
650.0000 mg | ORAL_TABLET | Freq: Once | ORAL | Status: AC
  Administered 2018-07-15: 650 mg via ORAL
  Filled 2018-07-15: qty 2

## 2018-07-15 MED ORDER — IBUPROFEN 800 MG PO TABS
800.0000 mg | ORAL_TABLET | Freq: Once | ORAL | Status: AC
  Administered 2018-07-15: 800 mg via ORAL
  Filled 2018-07-15: qty 1

## 2018-07-15 NOTE — Discharge Instructions (Addendum)
No acute abnormality seen that would require hospitalization at this time. Please return or be reevaluated if you have worsening symptoms. Continue ibuprofen at home Recheck with your doctor next week.

## 2018-07-15 NOTE — ED Notes (Signed)
Family at bedside. 

## 2018-07-15 NOTE — ED Notes (Signed)
Patient is alert and orientedx4.  Patient was explained discharge instructions and they understood them with no questions.   

## 2018-07-15 NOTE — ED Triage Notes (Signed)
PT reports Rt sided ABD pain. Pt reports diarrhea  x2  In 48 hrs.

## 2018-07-15 NOTE — ED Notes (Signed)
Patient transported to CT 

## 2018-07-15 NOTE — ED Provider Notes (Signed)
[facility] EMERGENCY DEPARTMENT Provider Note   CSN: [id number] Arrival date & time: 07/15/18  1833     History   Chief Complaint Chief Complaint  Patient presents with  . Abdominal Pain    HPI Olivia Mcdaniel is a 37 y.o. female.  HPI  37 year old female presents today complaining of normal pain for 4 days with associated diarrhea for 2 days.  She states that she has had 2-3 loose bowel movements over the past 2 days.  Ongoing abdominal pain.  Pain is worse in the right lower quadrant.  It sounds similar symptoms 1 month ago which is resolved.  She has some nausea but has not had any active vomiting.  Eating makes the symptoms somewhat worse.  She has been tolerating liquids without difficulty.  Had some subjective fever at home.  EMS reports that she was afebrile in route with normal blood pressure and heart rate she reports that she has not had a period for 3 months.  She has had a tubal ligation.  She reports began having some bleeding 3 days ago.  She states she has painful periods.  She describes the bleeding as similar to prior menstrual cycles.  Is not using any other birth control.  Has any history of STDs or abnormal vaginal discharge prior to the bleeding.  Past Medical History:  Diagnosis Date  . Gallstones   . Medical history non-contributory   . Ovarian cyst     Patient Active Problem List   Diagnosis Date Noted  . Polysubstance abuse (HCC) 07/01/2018  . Substance induced mood disorder (HCC) 07/01/2018  . Missed periods 12/28/2017  . Left ovarian cyst 12/28/2017  . Cholecystitis 06/17/2015    Past Surgical History:  Procedure Laterality Date  . CHOLECYSTECTOMY  06/17/2015  . CHOLECYSTECTOMY N/A 06/17/2015   Procedure: LAPAROSCOPIC CHOLECYSTECTOMY;  Surgeon: Emelia Loron, MD;  Location: [facility] OR;  Service: General;  Laterality: N/A;     OB History    Gravida  2   Para  2   Term  0   Preterm  2   AB      Living  2     SAB        TAB      Ectopic      Multiple      Live Births  2        Obstetric Comments  SVD at 36 wks. Not in the NICU. Delivered one in [place] and one at [facility] in [place]         Home Medications    Prior to Admission medications   Medication Sig Start Date End Date Taking? Authorizing Provider  albuterol (PROVENTIL HFA;VENTOLIN HFA) 108 (90 BASE) MCG/ACT inhaler Inhale 2 puffs into the lungs every 6 (six) hours as needed for wheezing. For shortness of breath Patient not taking: Reported on 11/26/2017 10/15/12   Johnnette Gourd, MD  naproxen sodium (ALEVE) 220 MG tablet Take 440 mg by mouth 2 (two) times daily as needed (pain).    [provider]    Family History Family History  Problem Relation Age of Onset  . Asthma Mother   . Miscarriages / [place] Mother   . Learning disabilities Sister   . Mental illness Sister   . Mental retardation Sister     Social History Social History   Tobacco Use  . Smoking status: Current Every Day Smoker    Packs/day: 0.50    Types: Cigarettes  .  Smokeless tobacco: Never Used  . Tobacco comment: declines  Substance Use Topics  . Alcohol use: Yes    Alcohol/week: 2.0 standard drinks    Types: 2 Glasses of wine per week  . Drug use: Yes    Types: Cocaine     Allergies   Penicillins and Morphine and related   Review of Systems Review of Systems  All other systems reviewed and are negative.    Physical Exam Updated Vital Signs BP 93/71 (BP Location: Right Arm)   Pulse 61   Temp 99.3 F (37.4 C) (Oral)   Ht 1.651 m (5\' 5" )   Wt 52.2 kg   LMP 07/15/2018   SpO2 100%   BMI 19.15 kg/m   Physical Exam  Constitutional: She appears well-developed and well-nourished.  HENT:  Head: Normocephalic and atraumatic.  Mouth/Throat: Oropharynx is clear and moist.  Extremely poor dentition  Eyes: Pupils are equal, round, and reactive to light.  Cardiovascular: Normal rate, regular rhythm, normal heart sounds  and intact distal pulses.  Pulmonary/Chest: Effort normal and breath sounds normal.  Abdominal: Soft. Normal appearance and bowel sounds are normal. There is tenderness in the right lower quadrant.  Genitourinary: Uterus is tender. Cervix exhibits no motion tenderness. Right adnexum displays no mass and no tenderness. Left adnexum displays no mass and no tenderness. There is bleeding in the vagina.  Neurological: She is alert.  Skin: Skin is warm. Capillary refill takes less than 2 seconds.  Psychiatric: She has a normal mood and affect.  Nursing note and vitals reviewed.    ED Treatments / Results  Labs (all labs ordered are listed, but only abnormal results are displayed) Labs Reviewed  COMPREHENSIVE METABOLIC PANEL - Abnormal; Notable for the following components:      Result Value   Chloride 112 (*)    Glucose, Bld 123 (*)    BUN 5 (*)    Calcium 8.7 (*)    Total Protein 6.1 (*)    Albumin 3.1 (*)    All other components within normal limits  URINALYSIS, ROUTINE W REFLEX MICROSCOPIC - Abnormal; Notable for the following components:   Color, Urine STRAW (*)    Specific Gravity, Urine 1.044 (*)    All other components within normal limits  CBC WITH DIFFERENTIAL/PLATELET  I-STAT BETA HCG BLOOD, ED ([facility], [facility], [facility] ONLY)    EKG None  Radiology Ct Abdomen Pelvis W Contrast  Result Date: 07/15/2018 CLINICAL DATA:  Right-sided abdominal pain, diarrhea. EXAM: CT ABDOMEN AND PELVIS WITH CONTRAST TECHNIQUE: Multidetector CT imaging of the abdomen and pelvis was performed using the standard protocol following bolus administration of intravenous contrast. CONTRAST:  OMNIPAQUE IOHEXOL 300 MG/ML  SOLN COMPARISON:  CT scan of November 26, 2017. FINDINGS: Lower chest: No acute abnormality. Hepatobiliary: No focal liver abnormality is seen. Status post cholecystectomy. No biliary dilatation. Pancreas: Unremarkable. No pancreatic ductal dilatation or surrounding inflammatory changes. Spleen:  Normal in size without focal abnormality. Adrenals/Urinary Tract: Adrenal glands are unremarkable. Kidneys are normal, without renal calculi, focal lesion, or hydronephrosis. Bladder is unremarkable. Stomach/Bowel: Stomach is within normal limits. Appendix appears normal. No evidence of bowel wall thickening, distention, or inflammatory changes. Vascular/Lymphatic: No significant vascular findings are present. No enlarged abdominal or pelvic lymph nodes. Reproductive: Uterus and ovaries are unremarkable. 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 Other: No abdominal wall hernia or abnormality. No abdominopelvic ascites. Musculoskeletal: No acute or significant osseous findings. 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 No other abnormality seen in the abdomen or pelvis. Electronically Signed   By: Lupita Raider, M.D.   On: 07/15/2018 20:16    Procedures Procedures (including critical care time)  Medications Ordered in ED Medications  sodium chloride 0.9 % bolus 1,000 mL (has no administration in time range)     Initial Impression / Assessment and Plan / ED Course  I have reviewed the triage vital signs and the nursing notes.  Pertinent labs & imaging results that were available during my care of the patient were reviewed by me and considered in my medical decision making (see chart for details).     37 year old female presents today complaining of right lower quadrant pain.  Today on exam she has mild right upper quadrant pain and some tenderness over her uterus on exam.  Has irregular menstrual cycles but is currently menstruating and this appears to be normal bleeding.  She has no complaints of vaginal discharge and no risk factors for infection.  Has some dilation of her left ovarian vein consistent with etiology reports of congestion  syndrome.  Patient is taking p.o. here without difficulty.  She is treated here with Tylenol.  Also given ibuprofen.  She is advised regarding conservative therapy and need for follow-up and voices understanding.  Final Clinical Impressions(s) / ED Diagnoses   Final diagnoses:  Abdominal pain, unspecified abdominal location  Pelvic pain    ED Discharge Orders    None       Margarita Grizzle, MD 07/15/18 2057

## 2018-07-24 ENCOUNTER — Emergency Department (HOSPITAL_COMMUNITY): Payer: Self-pay

## 2018-07-24 ENCOUNTER — Encounter (HOSPITAL_COMMUNITY): Payer: Self-pay

## 2018-07-24 ENCOUNTER — Observation Stay (HOSPITAL_COMMUNITY)
Admission: EM | Admit: 2018-07-24 | Discharge: 2018-07-25 | Disposition: A | Discharge status: Home / Self Care | Payer: Self-pay | Attending: Oncology | Admitting: Oncology

## 2018-07-24 DIAGNOSIS — F1410 Cocaine abuse, uncomplicated: Secondary | ICD-10-CM

## 2018-07-24 DIAGNOSIS — F17210 Nicotine dependence, cigarettes, uncomplicated: Secondary | ICD-10-CM | POA: Insufficient documentation

## 2018-07-24 DIAGNOSIS — R4189 Other symptoms and signs involving cognitive functions and awareness: Secondary | ICD-10-CM

## 2018-07-24 DIAGNOSIS — R55 Syncope and collapse: Secondary | ICD-10-CM

## 2018-07-24 DIAGNOSIS — Z885 Allergy status to narcotic agent status: Secondary | ICD-10-CM

## 2018-07-24 DIAGNOSIS — R072 Precordial pain: Secondary | ICD-10-CM

## 2018-07-24 DIAGNOSIS — F1910 Other psychoactive substance abuse, uncomplicated: Secondary | ICD-10-CM

## 2018-07-24 DIAGNOSIS — T405X1A Poisoning by cocaine, accidental (unintentional), initial encounter: Secondary | ICD-10-CM

## 2018-07-24 DIAGNOSIS — F17200 Nicotine dependence, unspecified, uncomplicated: Secondary | ICD-10-CM

## 2018-07-24 DIAGNOSIS — D72829 Elevated white blood cell count, unspecified: Secondary | ICD-10-CM

## 2018-07-24 DIAGNOSIS — E876 Hypokalemia: Secondary | ICD-10-CM

## 2018-07-24 DIAGNOSIS — Z23 Encounter for immunization: Secondary | ICD-10-CM | POA: Insufficient documentation

## 2018-07-24 DIAGNOSIS — R079 Chest pain, unspecified: Secondary | ICD-10-CM | POA: Insufficient documentation

## 2018-07-24 DIAGNOSIS — R404 Transient alteration of awareness: Secondary | ICD-10-CM

## 2018-07-24 DIAGNOSIS — F1499 Cocaine use, unspecified with unspecified cocaine-induced disorder: Principal | ICD-10-CM | POA: Insufficient documentation

## 2018-07-24 DIAGNOSIS — Z880 Allergy status to penicillin: Secondary | ICD-10-CM

## 2018-07-24 DIAGNOSIS — R4020 Unspecified coma: Secondary | ICD-10-CM | POA: Insufficient documentation

## 2018-07-24 LAB — CBC WITH DIFFERENTIAL/PLATELET
Abs Immature Granulocytes: 0.1 10*3/uL — ABNORMAL HIGH (ref 0.00–0.07)
Basophils Absolute: 0.1 10*3/uL (ref 0.0–0.1)
Basophils Relative: 0 %
Eosinophils Absolute: 0.3 10*3/uL (ref 0.0–0.5)
Eosinophils Relative: 1 %
HCT: 39.2 % (ref 36.0–46.0)
HEMOGLOBIN: 13 g/dL (ref 12.0–15.0)
Immature Granulocytes: 1 %
LYMPHS ABS: 1.9 10*3/uL (ref 0.7–4.0)
LYMPHS PCT: 9 %
MCH: 31.4 pg (ref 26.0–34.0)
MCHC: 33.2 g/dL (ref 30.0–36.0)
MCV: 94.7 fL (ref 80.0–100.0)
MONO ABS: 1.4 10*3/uL — AB (ref 0.1–1.0)
MONOS PCT: 7 %
NEUTROS ABS: 17.1 10*3/uL — AB (ref 1.7–7.7)
Neutrophils Relative %: 82 %
Platelets: 397 10*3/uL (ref 150–400)
RBC: 4.14 MIL/uL (ref 3.87–5.11)
RDW: 13.2 % (ref 11.5–15.5)
WBC: 20.8 10*3/uL — ABNORMAL HIGH (ref 4.0–10.5)
nRBC: 0 % (ref 0.0–0.2)

## 2018-07-24 LAB — I-STAT BETA HCG BLOOD, ED (MC, WL, AP ONLY)

## 2018-07-24 LAB — COMPREHENSIVE METABOLIC PANEL
ALT: 9 U/L (ref 0–44)
ANION GAP: 6 (ref 5–15)
AST: 17 U/L (ref 15–41)
Albumin: 3.4 g/dL — ABNORMAL LOW (ref 3.5–5.0)
Alkaline Phosphatase: 70 U/L (ref 38–126)
BUN: 5 mg/dL — ABNORMAL LOW (ref 6–20)
CO2: 25 mmol/L (ref 22–32)
Calcium: 8.4 mg/dL — ABNORMAL LOW (ref 8.9–10.3)
Chloride: 104 mmol/L (ref 98–111)
Creatinine, Ser: 0.8 mg/dL (ref 0.44–1.00)
GFR calc non Af Amer: >60 mL/min (ref >60)
GLUCOSE: 112 mg/dL — AB (ref 70–99)
Potassium: 3 mmol/L — ABNORMAL LOW (ref 3.5–5.1)
SODIUM: 135 mmol/L (ref 135–145)
TOTAL PROTEIN: 6.6 g/dL (ref 6.5–8.1)
Total Bilirubin: 0.5 mg/dL (ref 0.3–1.2)

## 2018-07-24 LAB — RAPID URINE DRUG SCREEN, HOSP PERFORMED
AMPHETAMINES: NOT DETECTED
Barbiturates: NOT DETECTED
Benzodiazepines: NOT DETECTED
Cocaine: POSITIVE — AB
Opiates: NOT DETECTED
Tetrahydrocannabinol: NOT DETECTED

## 2018-07-24 LAB — ACETAMINOPHEN LEVEL: Acetaminophen (Tylenol), Serum: <10 ug/mL — ABNORMAL LOW (ref 10–30)

## 2018-07-24 LAB — TROPONIN I

## 2018-07-24 LAB — SALICYLATE LEVEL

## 2018-07-24 LAB — ETHANOL: Alcohol, Ethyl (B): <10 mg/dL (ref <10)

## 2018-07-24 LAB — D-DIMER, QUANTITATIVE: D-Dimer, Quant: 0.28 ug/mL-FEU (ref 0.00–0.50)

## 2018-07-24 MED ORDER — ENOXAPARIN SODIUM 40 MG/0.4ML ~~LOC~~ SOLN
40.0000 mg | SUBCUTANEOUS | Status: DC
  Administered 2018-07-24: 40 mg via SUBCUTANEOUS
  Filled 2018-07-24 (×2): qty 0.4

## 2018-07-24 MED ORDER — LORAZEPAM 2 MG/ML IJ SOLN
0.5000 mg | Freq: Once | INTRAMUSCULAR | Status: AC
  Administered 2018-07-24: 0.5 mg via INTRAVENOUS
  Filled 2018-07-24: qty 1

## 2018-07-24 MED ORDER — ACETAMINOPHEN 325 MG PO TABS: 650.0000 mg | ORAL_TABLET | Freq: Four times a day (QID) | ORAL | Status: DC | PRN

## 2018-07-24 MED ORDER — HYDROXYZINE HCL 25 MG PO TABS
25.0000 mg | ORAL_TABLET | Freq: Once | ORAL | Status: AC
  Administered 2018-07-24: 25 mg via ORAL
  Filled 2018-07-24: qty 1

## 2018-07-24 MED ORDER — IOPAMIDOL (ISOVUE-370) INJECTION 76%
100.0000 mL | Freq: Once | INTRAVENOUS | Status: AC | PRN
  Administered 2018-07-24: 70 mL via INTRAVENOUS

## 2018-07-24 MED ORDER — POTASSIUM CHLORIDE IN NACL 40-0.9 MEQ/L-% IV SOLN
INTRAVENOUS | Status: AC
  Administered 2018-07-24: 100 mL/h via INTRAVENOUS
  Filled 2018-07-24: qty 1000

## 2018-07-24 MED ORDER — SENNOSIDES-DOCUSATE SODIUM 8.6-50 MG PO TABS: 1.0000 | ORAL_TABLET | Freq: Every evening | ORAL | Status: DC | PRN

## 2018-07-24 MED ORDER — INFLUENZA VAC SPLIT QUAD 0.5 ML IM SUSY
0.5000 mL | PREFILLED_SYRINGE | INTRAMUSCULAR | Status: AC
  Administered 2018-07-25: 0.5 mL via INTRAMUSCULAR

## 2018-07-24 MED ORDER — PNEUMOCOCCAL VAC POLYVALENT 25 MCG/0.5ML IJ INJ: 0.5000 mL | INJECTION | INTRAMUSCULAR | Status: DC

## 2018-07-24 MED ORDER — ACETAMINOPHEN 650 MG RE SUPP: 650.0000 mg | Freq: Four times a day (QID) | RECTAL | Status: DC | PRN
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
## 2018-07-24 NOTE — ED Notes (Signed)
Lunch tray ordered 
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
## 2018-07-24 NOTE — ED Notes (Signed)
Pt called sister who returned call, pt requesting this RN to update pt on pt being admitted but to not discuss cocaine use or what initially brought the pt into the ED, the update was made by this Rn in front of the pt, pt A&O x4
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
## 2018-07-25 ENCOUNTER — Encounter (HOSPITAL_COMMUNITY): Payer: Self-pay | Admitting: General Practice

## 2018-07-25 ENCOUNTER — Other Ambulatory Visit: Payer: Self-pay

## 2018-07-25 DIAGNOSIS — F1490 Cocaine use, unspecified, uncomplicated: Secondary | ICD-10-CM

## 2018-07-25 DIAGNOSIS — R55 Syncope and collapse: Secondary | ICD-10-CM

## 2018-07-25 DIAGNOSIS — Z590 Homelessness: Secondary | ICD-10-CM

## 2018-07-25 LAB — CBC
HCT: 38.3 % (ref 36.0–46.0)
HEMOGLOBIN: 12.6 g/dL (ref 12.0–15.0)
MCH: 31.4 pg (ref 26.0–34.0)
MCHC: 32.9 g/dL (ref 30.0–36.0)
MCV: 95.5 fL (ref 80.0–100.0)
NRBC: 0 % (ref 0.0–0.2)
Platelets: 352 10*3/uL (ref 150–400)
RBC: 4.01 MIL/uL (ref 3.87–5.11)
RDW: 13.2 % (ref 11.5–15.5)
WBC: 7 10*3/uL (ref 4.0–10.5)

## 2018-07-25 LAB — BASIC METABOLIC PANEL
ANION GAP: 4 — AB (ref 5–15)
BUN: 7 mg/dL (ref 6–20)
CALCIUM: 8.6 mg/dL — AB (ref 8.9–10.3)
CHLORIDE: 109 mmol/L (ref 98–111)
CO2: 25 mmol/L (ref 22–32)
Creatinine, Ser: 0.95 mg/dL (ref 0.44–1.00)
GFR calc Af Amer: >60 mL/min (ref >60)
GFR calc non Af Amer: >60 mL/min (ref >60)
GLUCOSE: 96 mg/dL (ref 70–99)
POTASSIUM: 4.4 mmol/L (ref 3.5–5.1)
Sodium: 138 mmol/L (ref 135–145)

## 2018-07-25 NOTE — Progress Notes (Signed)
Discharge instructions reviewed with pt. Pt has no questions at this time. Pt states she would schedule her follow up appointment with her PCP. Taxi Voucher given to pt. Pt denies any pain and states she is ready for d/c.

## 2018-07-25 NOTE — Care Management Note (Addendum)
Case Management Note  Patient Details  Name: Fabienne BrunsChristina M XXXFinney MRN: [id number] Date of Birth: [birth year]  Subjective/Objective:  Pt presented for cocaine abuse and found unresponsive. Pt is without PCP and Insurance at this time. CSW to follow for community resources.                   Action/Plan: CM did call [facility] to make hospital follow up appointment. Voicemail was left and office will need to call back. [facility] did call back and appointment scheduled and placed on AVS. Patient can utilize the [facility] for medications that will range in cost from $4.00-$10.00. MD can you e-scribe medications to the [facility] so patient can get medications before she transitions home. No further needs from CM at this time.   Expected Discharge Date:  07/25/18               Expected Discharge Plan:  Home/Self Care  In-House Referral:  Clinical Social Work, PCP / [facility] Connect  Discharge planning Services  CM Consult, Follow-up appt scheduled, Medication Assistance, Indigent [facility]  Post Acute Care Choice:  NA Choice offered to:  Patient  DME Arranged:  N/A DME Agency:  NA  HH Arranged:  NA HH Agency:  NA  Status of Service:  Completed, signed off  If discussed at [employer] Length of Stay Meetings, dates discussed:    Additional Comments: 1039 07-25-18 Tomi BambergerBrenda Graves-Bigelow, RN,BSN [phone] No new medications at this time.  Gala LewandowskyGraves-Bigelow, Manav Pierotti Kaye, RN 07/25/2018, 10:29 AM

## 2018-07-25 NOTE — Discharge Instructions (Signed)
Chest Wall Pain °Chest wall pain is pain in or around the bones and muscles of your chest. Sometimes, an injury causes this pain. Sometimes, the cause may not be known. This pain may take several weeks or longer to get better. °Follow these instructions at home: °Pay attention to any changes in your symptoms. Take these actions to help with your pain: °· Rest as told by your doctor. °· Avoid activities that cause pain. Try not to use your chest, belly (abdominal), or side muscles to lift heavy things. °· If directed, apply ice to the painful area: °? Put ice in a plastic bag. °? Place a towel between your skin and the bag. °? Leave the ice on for 20 minutes, 2-3 times per day. °· Take over-the-counter and prescription medicines only as told by your doctor. °· Do not use tobacco products, including cigarettes, chewing tobacco, and e-cigarettes. If you need help quitting, ask your doctor. °· Keep all follow-up visits as told by your doctor. This is important. ° °Contact a doctor if: °· You have a fever. °· Your chest pain gets worse. °· You have new symptoms. °Get help right away if: °· You feel sick to your stomach (nauseous) or you throw up (vomit). °· You feel sweaty or light-headed. °· You have a cough with phlegm (sputum) or you cough up blood. °· You are short of breath. °This information is not intended to replace advice given to you by your health care provider. Make sure you discuss any questions you have with your health care provider. °Document Released: 02/09/2008 Document Revised: 01/29/2016 Document Reviewed: 11/18/2014 °Elsevier Interactive Patient Education © 2018 Elsevier Inc. ° °

## 2018-07-25 NOTE — Progress Notes (Signed)
Pt states social work gave her a Horticulturist, commerciallist of homeless shelters. Pt states she is going back to her hotel tonight.
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
## 2018-07-31 LAB — URINE DRUGS OF ABUSE SCREEN W ALC, ROUTINE (REF LAB)
AMPHETAMINES, URINE: NEGATIVE ng/mL
Barbiturate, Ur: NEGATIVE ng/mL
Benzodiazepine Quant, Ur: NEGATIVE ng/mL
Cannabinoid Quant, Ur: NEGATIVE ng/mL
ETHANOL U, QUAN: NEGATIVE %
METHADONE SCREEN, URINE: NEGATIVE ng/mL
Opiate Quant, Ur: NEGATIVE ng/mL
PHENCYCLIDINE, UR: NEGATIVE ng/mL
PROPOXYPHENE, URINE: NEGATIVE ng/mL

## 2018-07-31 LAB — COCAINE CONF, UR: COCAINE METAB QUANT UR: POSITIVE — AB

## 2018-08-16 ENCOUNTER — Inpatient Hospital Stay (INDEPENDENT_AMBULATORY_CARE_PROVIDER_SITE_OTHER): Payer: Self-pay | Admitting: Physician Assistant

## 2018-09-29 ENCOUNTER — Emergency Department (HOSPITAL_COMMUNITY)
Admission: EM | Admit: 2018-09-29 | Discharge: 2018-09-29 | Disposition: A | Discharge status: Home / Self Care | Payer: Self-pay | Attending: Emergency Medicine | Admitting: Emergency Medicine

## 2018-09-29 ENCOUNTER — Emergency Department (HOSPITAL_COMMUNITY): Payer: Self-pay

## 2018-09-29 ENCOUNTER — Other Ambulatory Visit: Payer: Self-pay

## 2018-09-29 ENCOUNTER — Encounter (HOSPITAL_COMMUNITY): Payer: Self-pay | Admitting: *Deleted

## 2018-09-29 DIAGNOSIS — Y929 Unspecified place or not applicable: Secondary | ICD-10-CM | POA: Insufficient documentation

## 2018-09-29 DIAGNOSIS — Y999 Unspecified external cause status: Secondary | ICD-10-CM | POA: Insufficient documentation

## 2018-09-29 DIAGNOSIS — Y939 Activity, unspecified: Secondary | ICD-10-CM | POA: Insufficient documentation

## 2018-09-29 DIAGNOSIS — Z79899 Other long term (current) drug therapy: Secondary | ICD-10-CM | POA: Insufficient documentation

## 2018-09-29 DIAGNOSIS — S3210XA Unspecified fracture of sacrum, initial encounter for closed fracture: Secondary | ICD-10-CM | POA: Insufficient documentation

## 2018-09-29 DIAGNOSIS — J069 Acute upper respiratory infection, unspecified: Secondary | ICD-10-CM | POA: Insufficient documentation

## 2018-09-29 DIAGNOSIS — F17210 Nicotine dependence, cigarettes, uncomplicated: Secondary | ICD-10-CM | POA: Insufficient documentation

## 2018-09-29 DIAGNOSIS — S322XXA Fracture of coccyx, initial encounter for closed fracture: Secondary | ICD-10-CM

## 2018-09-29 DIAGNOSIS — W109XXA Fall (on) (from) unspecified stairs and steps, initial encounter: Secondary | ICD-10-CM | POA: Insufficient documentation

## 2018-09-29 MED ORDER — ALBUTEROL SULFATE HFA 108 (90 BASE) MCG/ACT IN AERS
1.0000 | INHALATION_SPRAY | Freq: Once | RESPIRATORY_TRACT | Status: AC
  Administered 2018-09-29: 1 via RESPIRATORY_TRACT
  Filled 2018-09-29: qty 6.7

## 2018-09-29 MED ORDER — KETOROLAC TROMETHAMINE 30 MG/ML IJ SOLN
30.0000 mg | Freq: Once | INTRAMUSCULAR | Status: AC
  Administered 2018-09-29: 30 mg via INTRAMUSCULAR
  Filled 2018-09-29: qty 1

## 2018-09-29 MED ORDER — OPTICHAMBER DIAMOND MISC
1.0000 | Freq: Once | Status: AC
  Administered 2018-09-29: 1
  Filled 2018-09-29: qty 1

## 2018-09-29 MED ORDER — AEROCHAMBER PLUS FLO-VU LARGE MISC
Status: AC
  Filled 2018-09-29: qty 1

## 2018-09-29 MED ORDER — ONDANSETRON 4 MG PO TBDP
4.0000 mg | ORAL_TABLET | Freq: Once | ORAL | Status: AC
  Administered 2018-09-29: 4 mg via ORAL
  Filled 2018-09-29: qty 1

## 2018-09-29 MED ORDER — HYDROCODONE-ACETAMINOPHEN 5-325 MG PO TABS: 1.0000 | ORAL_TABLET | ORAL | 0 refills | Status: AC | PRN

## 2018-09-29 MED ORDER — PROMETHAZINE HCL 25 MG PO TABS: 25.0000 mg | ORAL_TABLET | Freq: Four times a day (QID) | ORAL | 0 refills | Status: AC | PRN
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
## 2018-09-29 NOTE — ED Triage Notes (Signed)
Pt states that yesterday she tripped going down stairs (6 stairs).  Abrasion to coccyx.  States she stood up and almost passed out, but did not.  Now c/o cough, fever.  Temp 98.7. Pt took tylenol 500 mg at 10 am.

## 2018-09-29 NOTE — ED Notes (Signed)
Patient verbalizes understanding of discharge instructions. Opportunity for questioning and answers were provided. Armband removed by staff, pt discharged from ED.  

## 2019-01-15 ENCOUNTER — Encounter (HOSPITAL_COMMUNITY): Payer: Self-pay | Admitting: Emergency Medicine

## 2019-01-15 ENCOUNTER — Other Ambulatory Visit: Payer: Self-pay

## 2019-01-15 ENCOUNTER — Emergency Department (HOSPITAL_COMMUNITY)
Admission: EM | Admit: 2019-01-15 | Discharge: 2019-01-15 | Disposition: A | Discharge status: Home / Self Care | Payer: Self-pay | Attending: Emergency Medicine | Admitting: Emergency Medicine

## 2019-01-15 ENCOUNTER — Emergency Department (HOSPITAL_COMMUNITY): Payer: Self-pay

## 2019-01-15 DIAGNOSIS — Y929 Unspecified place or not applicable: Secondary | ICD-10-CM | POA: Insufficient documentation

## 2019-01-15 DIAGNOSIS — F17210 Nicotine dependence, cigarettes, uncomplicated: Secondary | ICD-10-CM | POA: Insufficient documentation

## 2019-01-15 DIAGNOSIS — Y999 Unspecified external cause status: Secondary | ICD-10-CM | POA: Insufficient documentation

## 2019-01-15 DIAGNOSIS — K029 Dental caries, unspecified: Secondary | ICD-10-CM

## 2019-01-15 DIAGNOSIS — Z79899 Other long term (current) drug therapy: Secondary | ICD-10-CM | POA: Insufficient documentation

## 2019-01-15 DIAGNOSIS — S0083XA Contusion of other part of head, initial encounter: Secondary | ICD-10-CM

## 2019-01-15 DIAGNOSIS — S00512A Abrasion of oral cavity, initial encounter: Secondary | ICD-10-CM

## 2019-01-15 DIAGNOSIS — Y939 Activity, unspecified: Secondary | ICD-10-CM | POA: Insufficient documentation

## 2019-01-15 DIAGNOSIS — Z23 Encounter for immunization: Secondary | ICD-10-CM | POA: Insufficient documentation

## 2019-01-15 HISTORY — DX: Other psychoactive substance abuse, uncomplicated: F19.10

## 2019-01-15 MED ORDER — IBUPROFEN 600 MG PO TABS: 600.0000 mg | ORAL_TABLET | Freq: Four times a day (QID) | ORAL | 0 refills | Status: AC | PRN

## 2019-01-15 MED ORDER — HYDROCODONE-ACETAMINOPHEN 5-325 MG PO TABS
1.0000 | ORAL_TABLET | Freq: Once | ORAL | Status: AC
  Administered 2019-01-15: 1 via ORAL
  Filled 2019-01-15: qty 1

## 2019-01-15 MED ORDER — TETANUS-DIPHTH-ACELL PERTUSSIS 5-2.5-18.5 LF-MCG/0.5 IM SUSP
0.5000 mL | Freq: Once | INTRAMUSCULAR | Status: AC
  Administered 2019-01-15: 0.5 mL via INTRAMUSCULAR
  Filled 2019-01-15: qty 0.5
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
## 2019-01-15 NOTE — ED Notes (Signed)
Patient given [place] sandwich/ apple sauce and soda.

## 2019-01-15 NOTE — ED Notes (Signed)
Patient transported to CT scan . 

## 2019-01-15 NOTE — Discharge Instructions (Addendum)
Take ibuprofen every 6 hours for pain. Cool compresses to the sore areas.   You have been given resources for outpatient primary care for routine medical concerns and pain management if needed, and to dental clinics for needed dental care. Please call to make an appointment with providers of your choice.
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
## 2020-01-17 ENCOUNTER — Emergency Department (HOSPITAL_COMMUNITY): Payer: Self-pay

## 2020-01-17 ENCOUNTER — Emergency Department (HOSPITAL_COMMUNITY)
Admission: EM | Admit: 2020-01-17 | Discharge: 2020-01-17 | Disposition: A | Discharge status: Home / Self Care | Payer: Self-pay | Attending: Emergency Medicine | Admitting: Emergency Medicine

## 2020-01-17 ENCOUNTER — Encounter (HOSPITAL_COMMUNITY): Payer: Self-pay

## 2020-01-17 DIAGNOSIS — R1031 Right lower quadrant pain: Secondary | ICD-10-CM | POA: Insufficient documentation

## 2020-01-17 LAB — URINALYSIS, ROUTINE W REFLEX MICROSCOPIC
Bilirubin Urine: NEGATIVE
Glucose, UA: NEGATIVE mg/dL
Hgb urine dipstick: NEGATIVE
Ketones, ur: NEGATIVE mg/dL
Nitrite: NEGATIVE
Protein, ur: NEGATIVE mg/dL
Specific Gravity, Urine: 1.009 (ref 1.005–1.030)
pH: 6 (ref 5.0–8.0)

## 2020-01-17 LAB — CBC
HCT: 37.7 % (ref 36.0–46.0)
Hemoglobin: 11.6 g/dL — ABNORMAL LOW (ref 12.0–15.0)
MCH: 27.8 pg (ref 26.0–34.0)
MCHC: 30.8 g/dL (ref 30.0–36.0)
MCV: 90.4 fL (ref 80.0–100.0)
Platelets: 445 10*3/uL — ABNORMAL HIGH (ref 150–400)
RBC: 4.17 MIL/uL (ref 3.87–5.11)
RDW: 15.9 % — ABNORMAL HIGH (ref 11.5–15.5)
WBC: 10.4 10*3/uL (ref 4.0–10.5)
nRBC: 0 % (ref 0.0–0.2)

## 2020-01-17 LAB — COMPREHENSIVE METABOLIC PANEL
ALT: 8 U/L (ref 0–44)
AST: 12 U/L — ABNORMAL LOW (ref 15–41)
Albumin: 3.3 g/dL — ABNORMAL LOW (ref 3.5–5.0)
Alkaline Phosphatase: 76 U/L (ref 38–126)
Anion gap: 7 (ref 5–15)
BUN: <5 mg/dL — ABNORMAL LOW (ref 6–20)
CO2: 24 mmol/L (ref 22–32)
Calcium: 8.7 mg/dL — ABNORMAL LOW (ref 8.9–10.3)
Chloride: 106 mmol/L (ref 98–111)
Creatinine, Ser: 0.89 mg/dL (ref 0.44–1.00)
GFR calc Af Amer: >60 mL/min (ref >60)
GFR calc non Af Amer: >60 mL/min (ref >60)
Glucose, Bld: 100 mg/dL — ABNORMAL HIGH (ref 70–99)
Potassium: 4.5 mmol/L (ref 3.5–5.1)
Sodium: 137 mmol/L (ref 135–145)
Total Bilirubin: 0.3 mg/dL (ref 0.3–1.2)
Total Protein: 6.5 g/dL (ref 6.5–8.1)

## 2020-01-17 LAB — LIPASE, BLOOD: Lipase: 29 U/L (ref 11–51)

## 2020-01-17 MED ORDER — POLYETHYLENE GLYCOL 3350 17 GM/SCOOP PO POWD: 17.0000 g | Freq: Two times a day (BID) | ORAL | 0 refills | Status: AC

## 2020-01-17 MED ORDER — SODIUM CHLORIDE 0.9% FLUSH: 3.0000 mL | Freq: Once | INTRAVENOUS | Status: DC

## 2020-01-17 MED ORDER — IOHEXOL 300 MG/ML  SOLN
100.0000 mL | Freq: Once | INTRAMUSCULAR | Status: AC | PRN
  Administered 2020-01-17: 100 mL via INTRAVENOUS

## 2020-01-17 MED ORDER — DOCUSATE SODIUM 100 MG PO CAPS: ORAL_CAPSULE | ORAL | 0 refills | Status: AC

## 2020-01-17 MED ORDER — FENTANYL CITRATE (PF) 100 MCG/2ML IJ SOLN
50.0000 ug | Freq: Once | INTRAMUSCULAR | Status: AC
  Administered 2020-01-17: 50 ug via INTRAVENOUS
  Filled 2020-01-17: qty 2

## 2020-01-17 NOTE — ED Triage Notes (Signed)
Pt comes via [facility], known ovarian cyst, has been having RLQ abd pain for the past 3 days with constipation.

## 2020-01-17 NOTE — ED Notes (Signed)
Patient verbalizes understanding of discharge instructions. Opportunity for questioning and answers were provided. Armband removed by staff, pt discharged from ED ambulatory.   

## 2020-01-17 NOTE — ED Notes (Signed)
Olivia Mcdaniel, sister, [phone] would like an update when available

## 2020-01-17 NOTE — ED Provider Notes (Signed)
[facility] EMERGENCY DEPARTMENT Provider Note   CSN: [id number] Arrival date & time: 01/17/20  0308     History Chief Complaint  Patient presents with  . Abdominal Pain    Olivia Mcdaniel is a 39 y.o. female.  Patient with past medical history notable for polysubstance abuse, ovarian cysts, presents to the emergency department with chief complaint of right lower quadrant pain.  She states that she has been having gradually worsening right lower quadrant pain over the past couple of days.  She denies any fevers or chills.  Denies any dysuria, hematuria, urgency, or frequency.  She states that she has irregular menstrual cycles, and is currently menstruating.  She reports history of ovarian cysts, and questions whether this is another one.  She also reports feeling constipated for the past 2 to 3 days.  Denies any successful treatments prior to arrival.  Past surgical history includes cholecystectomy.  The history is provided by the patient. No language interpreter was used.       Past Medical History:  Diagnosis Date  . Drug abuse ([place])   . Gallstones   . Medical history non-contributory   . Ovarian cyst     Patient Active Problem List   Diagnosis Date Noted  . Chest pain 07/24/2018  . Unresponsive episode 07/24/2018  . Polysubstance abuse ([place]) 07/01/2018  . Substance induced mood disorder ([place]) 07/01/2018  . Missed periods 12/28/2017  . Left ovarian cyst 12/28/2017  . Cholecystitis 06/17/2015    Past Surgical History:  Procedure Laterality Date  . CHOLECYSTECTOMY  06/17/2015  . CHOLECYSTECTOMY N/A 06/17/2015   Procedure: LAPAROSCOPIC CHOLECYSTECTOMY;  Surgeon: Rolm Bookbinder, MD;  Location: [facility] OR;  Service: General;  Laterality: N/A;     OB History    Gravida  2   Para  2   Term  0   Preterm  2   AB      Living  2     SAB      TAB      Ectopic      Multiple      Live Births  2        Obstetric Comments  SVD at 36  wks. Not in the NICU. Delivered one in [place] and one at [facility] in [place]        Family History  Problem Relation Age of Onset  . Asthma Mother   . Miscarriages / [place] Mother   . Learning disabilities Sister   . Mental illness Sister   . Mental retardation Sister     Social History   Tobacco Use  . Smoking status: Current Every Day Smoker    Packs/day: 0.25    Years: 2.00    Pack years: 0.50    Types: Cigarettes  . Smokeless tobacco: Never Used  . Tobacco comment: declines  Substance Use Topics  . Alcohol use: Yes    Alcohol/week: 2.0 standard drinks    Types: 2 Glasses of wine per week  . Drug use: Yes    Types: Cocaine    Home Medications Prior to Admission medications   Medication Sig Start Date End Date Taking? Authorizing Provider  albuterol (PROVENTIL HFA;VENTOLIN HFA) 108 (90 BASE) MCG/ACT inhaler Inhale 2 puffs into the lungs every 6 (six) hours as needed for wheezing. For shortness of breath Patient not taking: Reported on 11/26/2017 10/15/12   Little Ishikawa, MD  HYDROcodone-acetaminophen (NORCO/VICODIN) 5-325 MG tablet Take 1 tablet by mouth every 4 (four)  hours as needed. Patient not taking: Reported on 01/15/2019 09/29/18   Jacalyn Lefevre, MD  ibuprofen (ADVIL) 600 MG tablet Take 1 tablet (600 mg total) by mouth every 6 (six) hours as needed. 01/15/19   Elpidio Anis, PA-C  promethazine (PHENERGAN) 25 MG tablet Take 1 tablet (25 mg total) by mouth every 6 (six) hours as needed for nausea or vomiting. Patient not taking: Reported on 01/15/2019 09/29/18   Jacalyn Lefevre, MD    Allergies    Penicillins and Morphine and related  Review of Systems   Review of Systems  All other systems reviewed and are negative.   Physical Exam Updated Vital Signs BP (!) 97/59 (BP Location: Right Arm)   Pulse 81   Temp 98.6 F (37 C) (Oral)   Resp 16   SpO2 100%   Physical Exam Vitals and nursing note reviewed.  Constitutional:      General: She is not in  acute distress.    Appearance: She is well-developed.  HENT:     Head: Normocephalic and atraumatic.  Eyes:     Conjunctiva/sclera: Conjunctivae normal.  Cardiovascular:     Rate and Rhythm: Normal rate and regular rhythm.     Heart sounds: No murmur.  Pulmonary:     Effort: Pulmonary effort is normal. No respiratory distress.     Breath sounds: Normal breath sounds.  Abdominal:     Palpations: Abdomen is soft.     Tenderness: There is abdominal tenderness.     Comments: Right lower quadrant tender to palpation  Musculoskeletal:     Cervical back: Neck supple.  Skin:    General: Skin is warm and dry.  Neurological:     Mental Status: She is alert.     ED Results / Procedures / Treatments   Labs (all labs ordered are listed, but only abnormal results are displayed) Labs Reviewed  COMPREHENSIVE METABOLIC PANEL - Abnormal; Notable for the following components:      Result Value   Glucose, Bld 100 (*)    BUN <5 (*)    Calcium 8.7 (*)    Albumin 3.3 (*)    AST 12 (*)    All other components within normal limits  CBC - Abnormal; Notable for the following components:   Hemoglobin 11.6 (*)    RDW 15.9 (*)    Platelets 445 (*)    All other components within normal limits  URINALYSIS, ROUTINE W REFLEX MICROSCOPIC - Abnormal; Notable for the following components:   Leukocytes,Ua TRACE (*)    Bacteria, UA RARE (*)    All other components within normal limits  LIPASE, BLOOD  I-STAT BETA HCG BLOOD, ED ([facility], [facility], [facility] ONLY)    EKG None  Radiology CT ABDOMEN PELVIS W CONTRAST  Result Date: 01/17/2020 CLINICAL DATA:  Right lower quadrant abdominal pain. EXAM: CT ABDOMEN AND PELVIS WITH CONTRAST TECHNIQUE: Multidetector CT imaging of the abdomen and pelvis was performed using the standard protocol following bolus administration of intravenous contrast. CONTRAST:  OMNIPAQUE IOHEXOL 300 MG/ML  SOLN COMPARISON:  CT abdomen pelvis dated July 24, 2018. FINDINGS: Lower chest: No  acute abnormality. Hepatobiliary: Several tiny cysts are unchanged. No new focal liver abnormality. Status post cholecystectomy. No biliary dilatation. Pancreas: Unremarkable. No pancreatic ductal dilatation or surrounding inflammatory changes. Spleen: Normal in size without focal abnormality. Adrenals/Urinary Tract: The adrenal glands are unremarkable. Unchanged small simple cyst in the left kidney. Unchanged punctate calculus in the lower pole of the left kidney. No  hydronephrosis. The bladder is unremarkable. Stomach/Bowel: Stomach is within normal limits. Appendix appears normal. No evidence of bowel wall thickening, distention, or inflammatory changes. Vascular/Lymphatic: Prominent left gonadal vein and left greater than right parauterine veins again noted. Unremarkable abdominal aorta. No enlarged abdominal or pelvic lymph nodes. Reproductive: Uterus and bilateral adnexa are unremarkable. Other: No abdominal wall hernia or abnormality. No abdominopelvic ascites. No pneumoperitoneum. Musculoskeletal: No acute or significant osseous findings. IMPRESSION: 1. No acute intra-abdominal process. Normal appendix. 2. Unchanged punctate nonobstructive left nephrolithiasis. 3. Unchanged prominent left gonadal vein and left greater than right parauterine veins, which can be seen with pelvic congestion syndrome. Electronically Signed   By: Obie Dredge M.D.   On: 01/17/2020 06:14    Procedures Procedures (including critical care time)  Medications Ordered in ED Medications  sodium chloride flush (NS) 0.9 % injection 3 mL (has no administration in time range)  fentaNYL (SUBLIMAZE) injection 50 mcg (has no administration in time range)    ED Course  I have reviewed the triage vital signs and the nursing notes.  Pertinent labs & imaging results that were available during my care of the patient were reviewed by me and considered in my medical decision making (see chart for details).    MDM  Rules/Calculators/A&P                      Patient with right lower quadrant abdominal pain.  Patient symptoms have gradually worsened over the past couple of days.  She believes that she is constipated.  She states that she has had no bowel movements.  She also reports history of ovarian cysts, and is concerned that she might have an ovarian cyst.  Past medical history is notable for polysubstance abuse.  Given the right lower quadrant tenderness, will check CT to ensure no appendicitis.  CT scan of the abdomen pelvis ordered, interpreted, and independently reviewed by me.  No concerning findings for appendicitis.  No other acute intra-abdominal process seen.  Radiology reports unchanged punctate nonobstructive left nephrolithiasis as well as unchanged prominent left gonadal vein and periuterine veins commonly seen in pelvic congestion syndrome.  I do not think that this is the cause of the patient's pain.  I think that she is likely uncomfortable due to not having bowel movements.  I will start her on MiraLAX and Colace.  I discussed the treatment plan with the patient.  She is agreeable with the plan.  She asks for pain medication, encouraged her to increase her fluids and fiber, rather than giving her something which could constipate her further.  Especially want to avoid narcotics given her history of polysubstance abuse.  Laboratory work-up is reassuring.  No leukocytosis.  CMP reveals no significant electrolyte derrangement.  I-STAT hCG, not crossing in epic, but is negative.  I viewed the result in the mini-lab.    Final Clinical Impression(s) / ED Diagnoses Final diagnoses:  Right lower quadrant abdominal pain    Rx / DC Orders ED Discharge Orders         Ordered    polyethylene glycol powder (GLYCOLAX/MIRALAX) 17 GM/SCOOP powder  2 times daily     01/17/20 0619    docusate sodium (COLACE) 100 MG capsule     01/17/20 0619           Roxy Horseman, PA-C 01/17/20 0634      Melene Plan, DO 01/17/20 [id number]

## 2020-02-15 IMAGING — CT CT ABD-PELV W/ CM
2 of 4 series · 16 of 46 positions shown, 18 images · IV contrast (APPLIED)
Comparison: CT scan of November 26, 2017.

CLINICAL DATA: Right-sided abdominal pain, diarrhea.

EXAM:
CT ABDOMEN AND PELVIS WITH CONTRAST
TECHNIQUE: Multidetector CT imaging of the abdomen and pelvis was performed
using the standard protocol following bolus administration of
intravenous contrast.
CONTRAST:  100mL OMNIPAQUE IOHEXOL 300 MG/ML  SOLN

[Series 3: abd/ pelvis 5.0 i30f 2 · axial · 0.86mm/px · z∈[+650,+1070]mm · 13 of 92 slices shown, 15 images]
[im 4/92  soft-tissue]
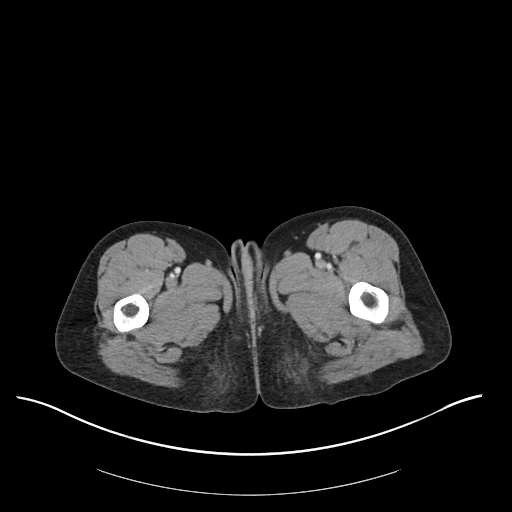
[im 4/92  bone]
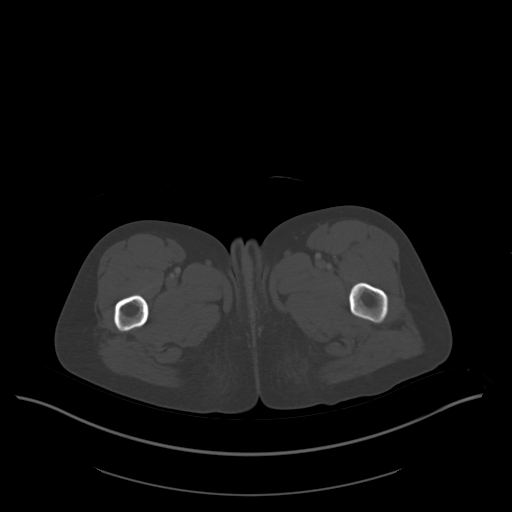
[im 11/92  soft-tissue]
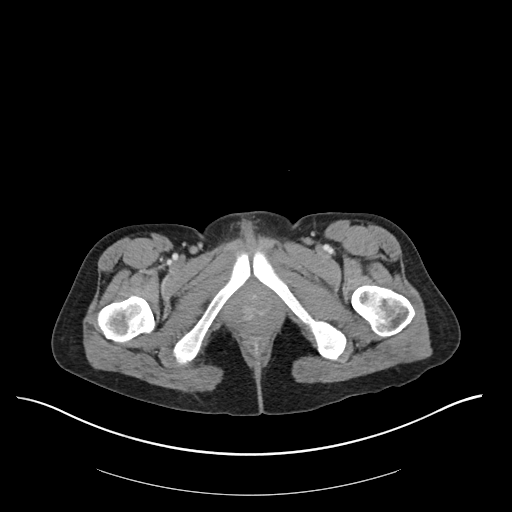
[im 19/92  soft-tissue]
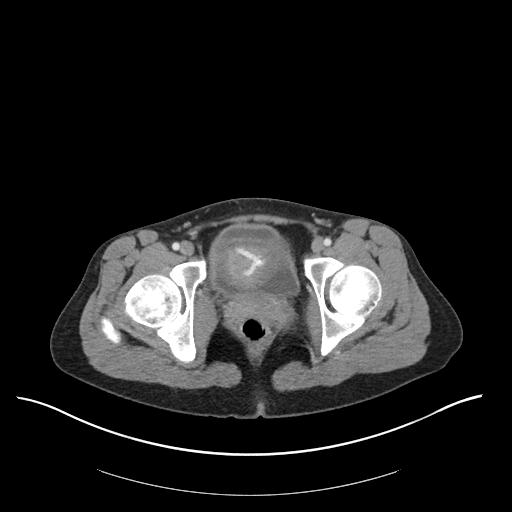
[im 26/92  soft-tissue]
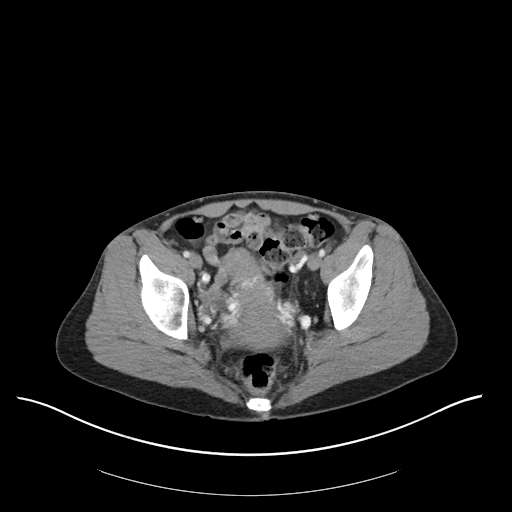
[im 33/92  soft-tissue]
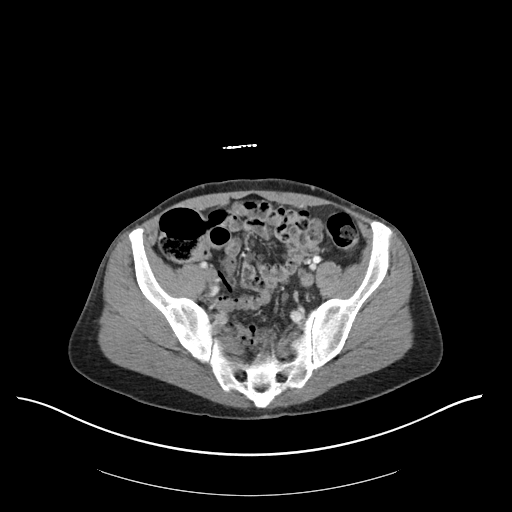
[im 41/92  soft-tissue]
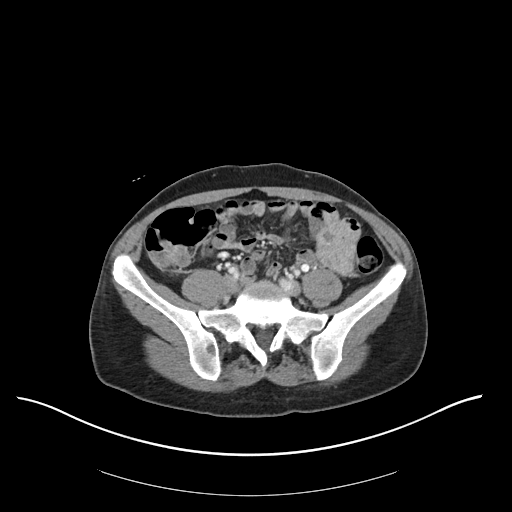
[im 48/92  soft-tissue]
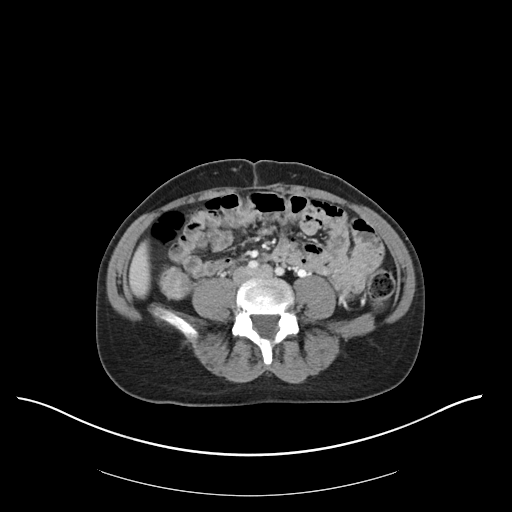
[im 51/92  soft-tissue]
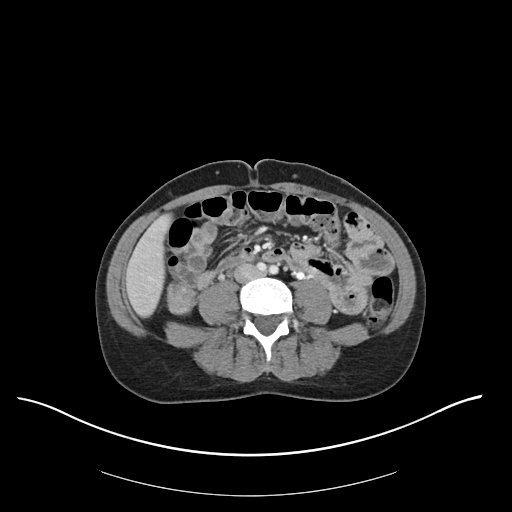
[im 59/92  soft-tissue]
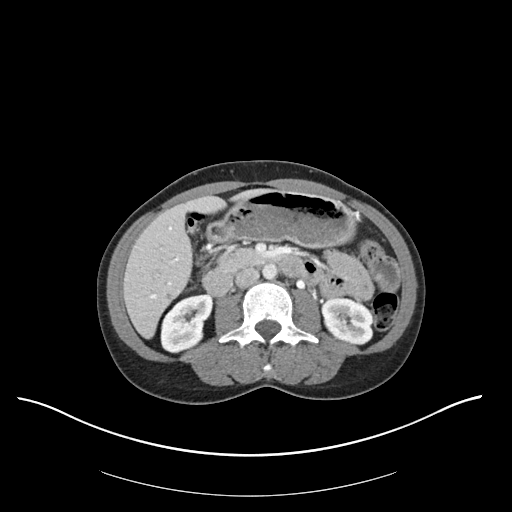
[im 59/92  bone]
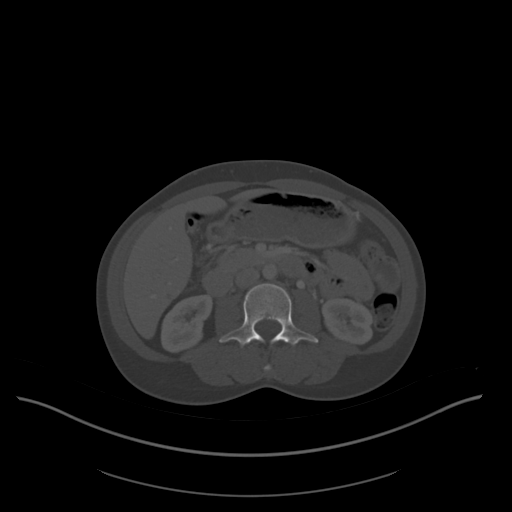
[im 66/92  soft-tissue]
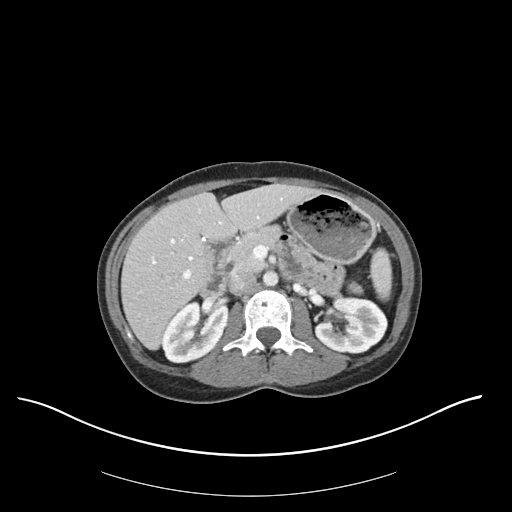
[im 73/92  soft-tissue]
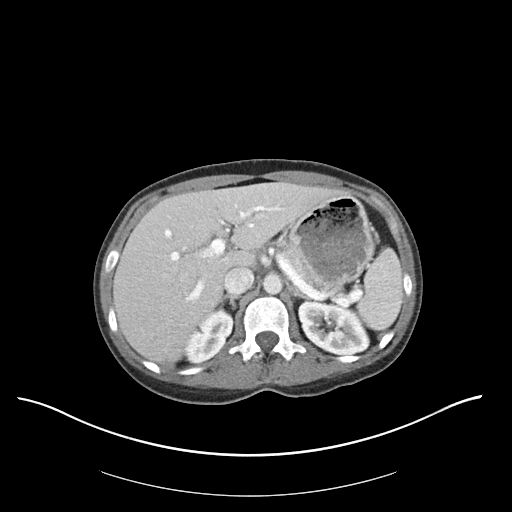
[im 81/92  soft-tissue]
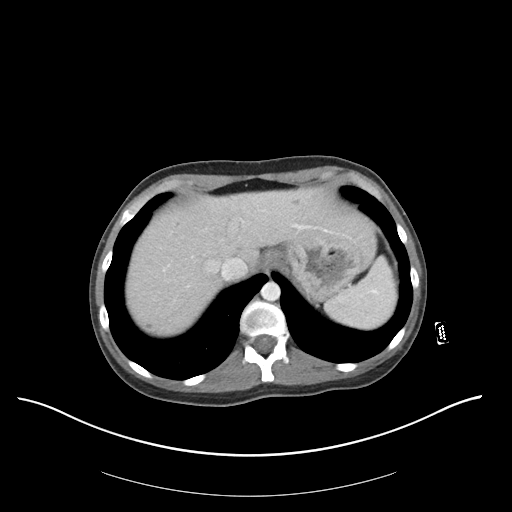
[im 88/92  soft-tissue]
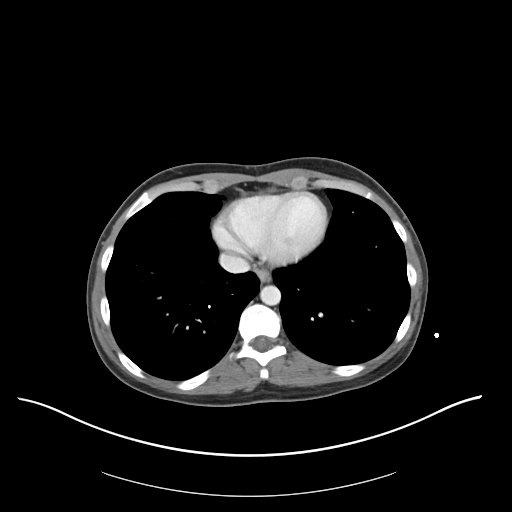

[Series 6: coronal soft tissue · coronal · 0.71mm/px · 3 of 93 slices shown]
[im 31/93  soft-tissue]
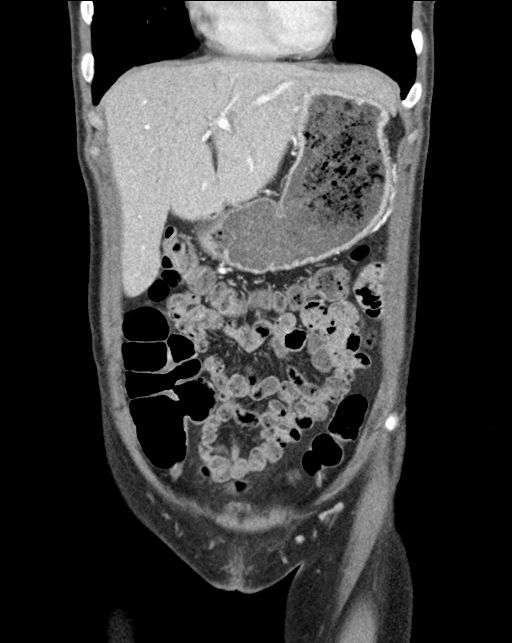
[im 41/93  soft-tissue]
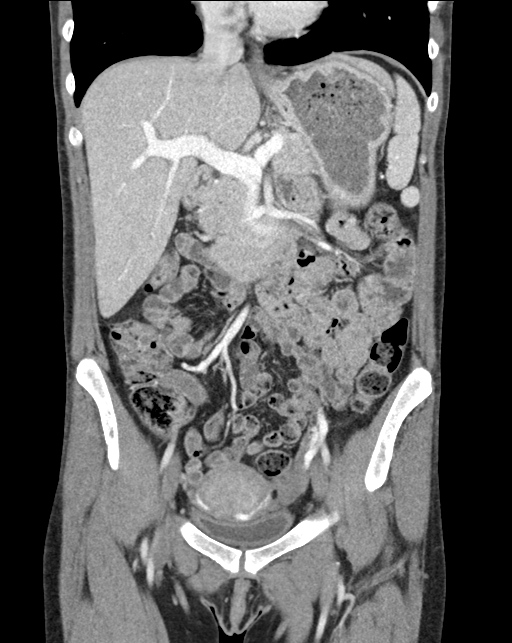
[im 52/93  soft-tissue]
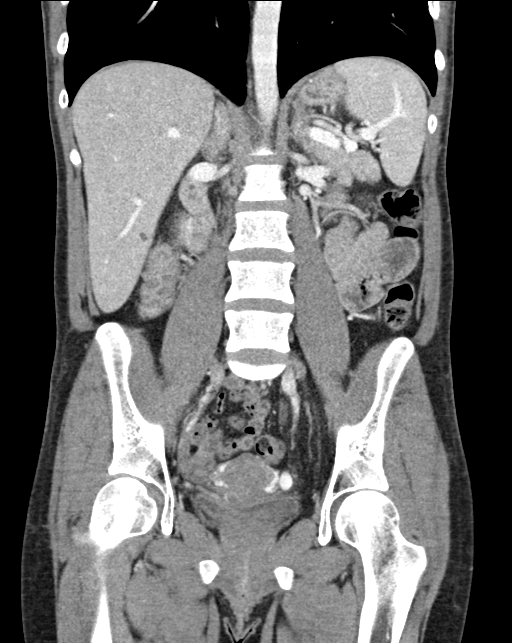

[16 of 46 positions shown; findings below may reference images not displayed]

FINDINGS: Lower chest: No acute abnormality.

Hepatobiliary: No focal liver abnormality is seen. Status post
cholecystectomy. No biliary dilatation.

Pancreas: Unremarkable. No pancreatic ductal dilatation or
surrounding inflammatory changes.

Spleen: Normal in size without focal abnormality.

Adrenals/Urinary Tract: Adrenal glands are unremarkable. Kidneys are
normal, without renal calculi, focal lesion, or hydronephrosis.
Bladder is unremarkable.

Stomach/Bowel: Stomach is within normal limits. Appendix appears
normal. No evidence of bowel wall thickening, distention, or
inflammatory changes.

Vascular/Lymphatic: No significant vascular findings are present. No
enlarged abdominal or pelvic lymph nodes.

Reproductive: Uterus and ovaries are unremarkable. Enlarged
left-sided pelvic varices are noted with dilated left ovarian vein
consistent with pelvic congestion syndrome.

Other: No abdominal wall hernia or abnormality. No abdominopelvic
ascites.

Musculoskeletal: No acute or significant osseous findings.
IMPRESSION: Enlarged left pelvic varices are noted with dilated left ovarian
vein consistent with pelvic congestion syndrome.

No other abnormality seen in the abdomen or pelvis.

## 2020-03-10 ENCOUNTER — Other Ambulatory Visit: Payer: Self-pay

## 2020-03-10 ENCOUNTER — Emergency Department (HOSPITAL_COMMUNITY)
Admission: EM | Admit: 2020-03-10 | Discharge: 2020-03-10 | Disposition: A | Discharge status: Home / Self Care | Payer: Self-pay | Attending: Emergency Medicine | Admitting: Emergency Medicine

## 2020-03-10 ENCOUNTER — Emergency Department (HOSPITAL_COMMUNITY): Payer: Self-pay

## 2020-03-10 ENCOUNTER — Encounter (HOSPITAL_COMMUNITY): Payer: Self-pay | Admitting: *Deleted

## 2020-03-10 DIAGNOSIS — R0789 Other chest pain: Secondary | ICD-10-CM | POA: Insufficient documentation

## 2020-03-10 DIAGNOSIS — R110 Nausea: Secondary | ICD-10-CM | POA: Insufficient documentation

## 2020-03-10 DIAGNOSIS — K0889 Other specified disorders of teeth and supporting structures: Secondary | ICD-10-CM | POA: Insufficient documentation

## 2020-03-10 DIAGNOSIS — R0602 Shortness of breath: Secondary | ICD-10-CM | POA: Insufficient documentation

## 2020-03-10 DIAGNOSIS — F17210 Nicotine dependence, cigarettes, uncomplicated: Secondary | ICD-10-CM | POA: Insufficient documentation

## 2020-03-10 DIAGNOSIS — F1490 Cocaine use, unspecified, uncomplicated: Secondary | ICD-10-CM | POA: Insufficient documentation

## 2020-03-10 DIAGNOSIS — R1084 Generalized abdominal pain: Secondary | ICD-10-CM | POA: Insufficient documentation

## 2020-03-10 LAB — CBC
HCT: 37 % (ref 36.0–46.0)
Hemoglobin: 11.6 g/dL — ABNORMAL LOW (ref 12.0–15.0)
MCH: 27 pg (ref 26.0–34.0)
MCHC: 31.4 g/dL (ref 30.0–36.0)
MCV: 86 fL (ref 80.0–100.0)
Platelets: 476 10*3/uL — ABNORMAL HIGH (ref 150–400)
RBC: 4.3 MIL/uL (ref 3.87–5.11)
RDW: 18 % — ABNORMAL HIGH (ref 11.5–15.5)
WBC: 12.7 10*3/uL — ABNORMAL HIGH (ref 4.0–10.5)
nRBC: 0 % (ref 0.0–0.2)

## 2020-03-10 LAB — COMPREHENSIVE METABOLIC PANEL
ALT: 8 U/L (ref 0–44)
AST: 15 U/L (ref 15–41)
Albumin: 3.6 g/dL (ref 3.5–5.0)
Alkaline Phosphatase: 72 U/L (ref 38–126)
Anion gap: 9 (ref 5–15)
BUN: 5 mg/dL — ABNORMAL LOW (ref 6–20)
CO2: 26 mmol/L (ref 22–32)
Calcium: 8.7 mg/dL — ABNORMAL LOW (ref 8.9–10.3)
Chloride: 103 mmol/L (ref 98–111)
Creatinine, Ser: 0.81 mg/dL (ref 0.44–1.00)
GFR calc Af Amer: >60 mL/min (ref >60)
GFR calc non Af Amer: >60 mL/min (ref >60)
Glucose, Bld: 96 mg/dL (ref 70–99)
Potassium: 3.5 mmol/L (ref 3.5–5.1)
Sodium: 138 mmol/L (ref 135–145)
Total Bilirubin: 0.7 mg/dL (ref 0.3–1.2)
Total Protein: 7.1 g/dL (ref 6.5–8.1)

## 2020-03-10 LAB — URINALYSIS, ROUTINE W REFLEX MICROSCOPIC
Bilirubin Urine: NEGATIVE
Glucose, UA: NEGATIVE mg/dL
Hgb urine dipstick: NEGATIVE
Ketones, ur: NEGATIVE mg/dL
Nitrite: NEGATIVE
Protein, ur: NEGATIVE mg/dL
Specific Gravity, Urine: 1.003 — ABNORMAL LOW (ref 1.005–1.030)
pH: 7 (ref 5.0–8.0)

## 2020-03-10 LAB — LIPASE, BLOOD: Lipase: 23 U/L (ref 11–51)

## 2020-03-10 LAB — I-STAT BETA HCG BLOOD, ED (MC, WL, AP ONLY): I-stat hCG, quantitative: <5 m[IU]/mL (ref <5)

## 2020-03-10 MED ORDER — ACETAMINOPHEN 500 MG PO TABS
1000.0000 mg | ORAL_TABLET | Freq: Once | ORAL | Status: AC
  Administered 2020-03-10: 1000 mg via ORAL
  Filled 2020-03-10: qty 2

## 2020-03-10 MED ORDER — ONDANSETRON 4 MG PO TBDP
4.0000 mg | ORAL_TABLET | Freq: Once | ORAL | Status: AC
  Administered 2020-03-10: 4 mg via ORAL
  Filled 2020-03-10: qty 1

## 2020-03-10 MED ORDER — ONDANSETRON 4 MG PO TBDP
4.0000 mg | ORAL_TABLET | Freq: Three times a day (TID) | ORAL | 0 refills | Status: DC | PRN
Start: 2020-03-10 — End: 2021-03-27

## 2020-03-10 MED ORDER — SODIUM CHLORIDE 0.9% FLUSH: 3.0000 mL | Freq: Once | INTRAVENOUS | Status: DC

## 2020-03-10 NOTE — ED Triage Notes (Signed)
The pt also has multiple complaints c/o a toothache lmp 1-2 months  She admits to using cocaine 2 days ago  She was having pain prior to the sue

## 2020-03-10 NOTE — ED Notes (Signed)
Pt c/o  Trouble breathing  No visible diffivulty breathing 02 sats 100%

## 2020-03-10 NOTE — ED Provider Notes (Signed)
[facility] EMERGENCY DEPARTMENT Provider Note   CSN: [id number] Arrival date & time: 03/10/20  0531     History Chief Complaint  Patient presents with  . Abdominal Pain    Olivia Mcdaniel is a 39 y.o. female.  HPI  Patient is a 39 year old female with past medical history significant for ovarian cysts, cocaine use, chest pain  Patient presents today with multiple complaints consisting of abdominal pain for 1 week, toothache for several weeks, dehydration, chest pain or shortness of breath.  Patient states that she last used cocaine 2 days ago.   Patient states that her abdominal pain is generalized.  She endorses nausea but no vomiting.  She states that her symptoms later worsened nor completely gone away for the past 1 week.  She denies any urinary frequency, dysuria, pelvic pain she denies any vaginal discharge or vaginal bleeding.  States her last menstrual period was "some time ago".   Patient states that her tooth ache has been ongoing for several weeks/1 month.  She denies any fevers, chills, worsening.  Further pushing it appears that this is been ongoing for quite some time.  She denies any trauma to her teeth.  States that she does not see a dentist.  Patient states that this morning she had no chest pain or shortness of breath but states that these have resolved at this time.  She says the chest pain is an achy pressure-like sensation.  She states that she uses cocaine regularly last used 2 days ago.  She denies any drug use other than marijuana.  She denies any alcohol use.  Patient initially endorses abdominal states she simply felt that she was it appears that patient has not vomited at all.  Nausea is intermittent and ongoing.  Patient states that she doubts that she is pregnant.  She is not taking any oral contraceptive pills.  No history of VTE, recent immobilization or surgery and she is not a cancer patient.  Patient states she does not drink water.   She states that she occasionally drinks sodas however states that she does not like a water tastes.  She endorses chronic constipation.    Past Medical History:  Diagnosis Date  . Drug abuse ([facility])   . Gallstones   . Medical history non-contributory   . Ovarian cyst     Patient Active Problem List   Diagnosis Date Noted  . Chest pain 07/24/2018  . Unresponsive episode 07/24/2018  . Polysubstance abuse ([facility]) 07/01/2018  . Substance induced mood disorder ([facility]) 07/01/2018  . Missed periods 12/28/2017  . Left ovarian cyst 12/28/2017  . Cholecystitis 06/17/2015    Past Surgical History:  Procedure Laterality Date  . CHOLECYSTECTOMY  06/17/2015  . CHOLECYSTECTOMY N/A 06/17/2015   Procedure: LAPAROSCOPIC CHOLECYSTECTOMY;  Surgeon: Emelia Loron, MD;  Location: [facility] OR;  Service: General;  Laterality: N/A;     OB History    Gravida  2   Para  2   Term  0   Preterm  2   AB      Living  2     SAB      TAB      Ectopic      Multiple      Live Births  2        Obstetric Comments  SVD at 36 wks. Not in the NICU. Delivered one in [place] and one at [facility] in [place]        Family  History  Problem Relation Age of Onset  . Asthma Mother   . Miscarriages / [place] Mother   . Learning disabilities Sister   . Mental illness Sister   . Mental retardation Sister     Social History   Tobacco Use  . Smoking status: Current Every Day Smoker    Packs/day: 0.25    Years: 2.00    Pack years: 0.50    Types: Cigarettes  . Smokeless tobacco: Never Used  . Tobacco comment: declines  Vaping Use  . Vaping Use: Never used  Substance Use Topics  . Alcohol use: Yes    Alcohol/week: 2.0 standard drinks    Types: 2 Glasses of wine per week  . Drug use: Yes    Types: Cocaine    Home Medications Prior to Admission medications   Medication Sig Start Date End Date Taking? Authorizing Provider  albuterol (PROVENTIL HFA;VENTOLIN HFA) 108 (90 BASE) MCG/ACT  inhaler Inhale 2 puffs into the lungs every 6 (six) hours as needed for wheezing. For shortness of breath Patient not taking: Reported on 11/26/2017 10/15/12   Johnnette GourdBoyd, Erin, MD  docusate sodium (COLACE) 100 MG capsule Take 2 tablets daily until you have a BM, then 1 tablet daily until stool is soft. Patient not taking: Reported on 03/10/2020 01/17/20   Roxy HorsemanBrowning, Robert, PA-C  HYDROcodone-acetaminophen (NORCO/VICODIN) 5-325 MG tablet Take 1 tablet by mouth every 4 (four) hours as needed. Patient not taking: Reported on 01/15/2019 09/29/18   Jacalyn LefevreHaviland, Julie, MD  ibuprofen (ADVIL) 600 MG tablet Take 1 tablet (600 mg total) by mouth every 6 (six) hours as needed. Patient not taking: Reported on 03/10/2020 01/15/19   Elpidio AnisUpstill, Shari, PA-C  ondansetron (ZOFRAN ODT) 4 MG disintegrating tablet Take 1 tablet (4 mg total) by mouth every 8 (eight) hours as needed for nausea or vomiting. 03/10/20   Gailen ShelterFondaw, Martiza Speth S, PA  polyethylene glycol powder (GLYCOLAX/MIRALAX) 17 GM/SCOOP powder Take 17 g by mouth 2 (two) times daily. Patient not taking: Reported on 03/10/2020 01/17/20   Roxy HorsemanBrowning, Robert, PA-C  promethazine (PHENERGAN) 25 MG tablet Take 1 tablet (25 mg total) by mouth every 6 (six) hours as needed for nausea or vomiting. Patient not taking: Reported on 01/15/2019 09/29/18   Jacalyn LefevreHaviland, Julie, MD    Allergies    Penicillins and Morphine and related  Review of Systems   Review of Systems  Constitutional: Positive for fatigue. Negative for chills and fever.  HENT: Negative for congestion.   Eyes: Negative for pain.  Respiratory: Positive for shortness of breath. Negative for cough.   Cardiovascular: Negative for chest pain and leg swelling.       No chest pain currently  Gastrointestinal: Positive for abdominal pain, constipation and nausea. Negative for vomiting.  Genitourinary: Negative for dysuria.  Musculoskeletal: Negative for myalgias.  Skin: Negative for rash.  Neurological: Negative for dizziness and headaches.     Physical Exam Updated Vital Signs BP 104/74   Pulse 67   Temp 98.1 F (36.7 C) (Oral)   Resp 12   Ht 5\' 3"  (1.6 m)   Wt 52.4 kg   LMP 12/10/2019   SpO2 99%   BMI 20.46 kg/m   Physical Exam Vitals and nursing note reviewed.  Constitutional:      General: She is not in acute distress.    Comments: Pleasant, disheveled 39 year old female in no acute distress.  Laying comfortably in bed.  HENT:     Head: Normocephalic and atraumatic.     Nose:  Nose normal.     Mouth/Throat:     Mouth: Mucous membranes are dry.     Comments: Poor dentition throughout.  No dental abscesses.  Periapical abscesses not present.  No swelling erythema or gums. Eyes:     General: No scleral icterus. Cardiovascular:     Rate and Rhythm: Normal rate and regular rhythm.     Pulses: Normal pulses.     Heart sounds: Normal heart sounds.  Pulmonary:     Effort: Pulmonary effort is normal. No respiratory distress.     Breath sounds: No wheezing.     Comments: Normal respiratory effort.  No increased work of breathing.  Sounds are clear in all fields. Abdominal:     Palpations: Abdomen is soft.     Tenderness: There is no abdominal tenderness. There is no right CVA tenderness, left CVA tenderness, guarding or rebound.  Musculoskeletal:     Cervical back: Normal range of motion.     Right lower leg: No edema.     Left lower leg: No edema.  Skin:    General: Skin is warm and dry.     Capillary Refill: Capillary refill takes less than 2 seconds.  Neurological:     Mental Status: She is alert. Mental status is at baseline.     Comments: Moves all 4 extremities.  Sensation intact all 4 extremities.  Psychiatric:        Mood and Affect: Mood normal.        Behavior: Behavior normal.     ED Results / Procedures / Treatments   Labs (all labs ordered are listed, but only abnormal results are displayed) Labs Reviewed  COMPREHENSIVE METABOLIC PANEL - Abnormal; Notable for the following components:       Result Value   BUN 5 (*)    Calcium 8.7 (*)    All other components within normal limits  CBC - Abnormal; Notable for the following components:   WBC 12.7 (*)    Hemoglobin 11.6 (*)    RDW 18.0 (*)    Platelets 476 (*)    All other components within normal limits  URINALYSIS, ROUTINE W REFLEX MICROSCOPIC - Abnormal; Notable for the following components:   Color, Urine STRAW (*)    Specific Gravity, Urine 1.003 (*)    Leukocytes,Ua SMALL (*)    Bacteria, UA MANY (*)    All other components within normal limits  LIPASE, BLOOD  I-STAT BETA HCG BLOOD, ED ([facility], [facility], [facility] ONLY)    EKG None  Radiology DG Chest Portable 1 View  Result Date: 03/10/2020 CLINICAL DATA:  Shortness of breath and nausea and vomiting EXAM: PORTABLE CHEST 1 VIEW COMPARISON:  09/29/2018 FINDINGS: The heart size and mediastinal contours are within normal limits. Both lungs are clear. The visualized skeletal structures are unremarkable. IMPRESSION: No active disease. Electronically Signed   By: Judie Petit.  Shick M.D.   On: 03/10/2020 08:00    Procedures Procedures (including critical care time)  Medications Ordered in ED Medications  acetaminophen (TYLENOL) tablet 1,000 mg (1,000 mg Oral Given 03/10/20 0834)  ondansetron (ZOFRAN-ODT) disintegrating tablet 4 mg (4 mg Oral Given 03/10/20 7564)    ED Course  I have reviewed the triage vital signs and the nursing notes.  Pertinent labs & imaging results that were available during my care of the patient were reviewed by me and considered in my medical decision making (see chart for details).  Patient is a 38 year old female presented today with multiple complaints.  Chest pain, shortness of breath-now resolved.  Abdominal pain, nausea, decreased urination, dental pain.   Physical exam is unremarkable.  She does have poor dentition but no evidence of infection.  She will need to follow-up with a dentist and as it appears that her is pain has been ongoing for extended period of  time she has no symptoms indicate infection will left antibiotics at this time.  Suspect that most of her symptoms are secondary to cocaine use and severe dehydration.  At time of discharge she is complaining more of insomnia than any other symptoms.  Clinical Course as of Mar 10 928  Mon Mar 10, 2020  0815 EKG reviewed by myself.  It is NSR.  Normal axis, no ST-T wave changes.  Normal intervals.  No evidence of acute ischemia.   [WF]  0815 Plain film chest portable 1 view without any acute abnormality.   [WF]  0815 CBC with mild stable leukocytosis which is not significantly elevated from prior labs; no significant anemia.  Patient has no fevers or tachycardia or hypotension.  She is well-appearing.  Satting 100% on room air. Urinalysis is not consistent with infection.  Appears unremarkable.  Patient does seem to be somewhat dehydrated.  She was p.o. challenged by nursing staff unable to drink 2 glasses of water.  No episodes of emesis.   [WF]    Clinical Course User Index [WF] Gailen Shelter, [place]   MDM Rules/Calculators/A&P                          Patient is PERC negative, young with no risk factors doubt ACS.  EKG is without any acute abnormalities no ischemia.  No ST-T wave changes, isolated T wave inversion in V1 which is unremarkable.  Patient is overall well-appearing although slightly dehydrated.  She is tolerating p.o. drink 3 glasses of water before discharge.  She had Zofran Tylenol and states she is no longer nauseous.  I will discharge with follow-up with PCP and dentistry.   Final Clinical Impression(s) / ED Diagnoses Final diagnoses:  Nausea  Generalized abdominal pain  Toothache    Rx / DC Orders ED Discharge Orders         Ordered    ondansetron (ZOFRAN ODT) 4 MG disintegrating tablet  Every 8 hours PRN     Discontinue  Reprint     03/10/20 0831           Gailen Shelter, PA 03/10/20 1610    Margarita Grizzle, MD 03/10/20 1550

## 2020-03-10 NOTE — Discharge Instructions (Signed)
Please continue to use Tylenol or ibuprofen for pain.  Your tooth does not appear infected today however I want you to follow-up with your dentist.  I provided you with the phone number for 1.  Please tell them you are seen in the emergency department.  Please use Zofran as needed for nausea.  Please increase much water you are drinking.  You should be drinking 6-8 glasses of water per day if you are not immune asthma she will become dehydrated and will be constipated.

## 2020-03-10 NOTE — ED Notes (Signed)
Pt given water to drink per EDP order; pt encouraged to drink

## 2020-03-10 NOTE — ED Triage Notes (Signed)
The pt arrived by gems from home abd pain anxiety for 3 days  With n and v

## 2020-03-10 NOTE — ED Notes (Signed)
Pt tolerating PO fluids

## 2020-03-10 NOTE — ED Notes (Signed)
Patient verbalizes understanding of discharge instructions. Opportunity for questioning and answers were provided. Pt discharged from ED. 

## 2020-11-13 ENCOUNTER — Other Ambulatory Visit: Payer: Self-pay

## 2020-11-13 ENCOUNTER — Emergency Department (HOSPITAL_COMMUNITY)
Admission: EM | Admit: 2020-11-13 | Discharge: 2020-11-14 | Disposition: A | Discharge status: Home / Self Care | Payer: Self-pay | Attending: Emergency Medicine | Admitting: Emergency Medicine

## 2020-11-13 ENCOUNTER — Encounter (HOSPITAL_COMMUNITY): Payer: Self-pay

## 2020-11-13 DIAGNOSIS — R109 Unspecified abdominal pain: Secondary | ICD-10-CM | POA: Insufficient documentation

## 2020-11-13 DIAGNOSIS — Z5321 Procedure and treatment not carried out due to patient leaving prior to being seen by health care provider: Secondary | ICD-10-CM | POA: Insufficient documentation

## 2020-11-13 DIAGNOSIS — R1110 Vomiting, unspecified: Secondary | ICD-10-CM | POA: Insufficient documentation

## 2020-11-13 LAB — PREGNANCY, URINE: Preg Test, Ur: NEGATIVE

## 2020-11-13 LAB — COMPREHENSIVE METABOLIC PANEL
ALT: 19 U/L (ref 0–44)
AST: 24 U/L (ref 15–41)
Albumin: 3.1 g/dL — ABNORMAL LOW (ref 3.5–5.0)
Alkaline Phosphatase: 85 U/L (ref 38–126)
Anion gap: 8 (ref 5–15)
BUN: 6 mg/dL (ref 6–20)
CO2: 26 mmol/L (ref 22–32)
Calcium: 8.3 mg/dL — ABNORMAL LOW (ref 8.9–10.3)
Chloride: 99 mmol/L (ref 98–111)
Creatinine, Ser: 0.67 mg/dL (ref 0.44–1.00)
GFR, Estimated: >60 mL/min (ref >60)
Glucose, Bld: 95 mg/dL (ref 70–99)
Potassium: 3.4 mmol/L — ABNORMAL LOW (ref 3.5–5.1)
Sodium: 133 mmol/L — ABNORMAL LOW (ref 135–145)
Total Bilirubin: 0.5 mg/dL (ref 0.3–1.2)
Total Protein: 6.6 g/dL (ref 6.5–8.1)

## 2020-11-13 LAB — URINALYSIS, ROUTINE W REFLEX MICROSCOPIC
Bilirubin Urine: NEGATIVE
Glucose, UA: NEGATIVE mg/dL
Ketones, ur: NEGATIVE mg/dL
Nitrite: POSITIVE — AB
Protein, ur: NEGATIVE mg/dL
Specific Gravity, Urine: 1.015 (ref 1.005–1.030)
pH: 5 (ref 5.0–8.0)

## 2020-11-13 LAB — LIPASE, BLOOD: Lipase: 27 U/L (ref 11–51)

## 2020-11-13 LAB — CBC
HCT: 29.7 % — ABNORMAL LOW (ref 36.0–46.0)
Hemoglobin: 9.3 g/dL — ABNORMAL LOW (ref 12.0–15.0)
MCH: 27.7 pg (ref 26.0–34.0)
MCHC: 31.3 g/dL (ref 30.0–36.0)
MCV: 88.4 fL (ref 80.0–100.0)
Platelets: 424 10*3/uL — ABNORMAL HIGH (ref 150–400)
RBC: 3.36 MIL/uL — ABNORMAL LOW (ref 3.87–5.11)
RDW: 15.8 % — ABNORMAL HIGH (ref 11.5–15.5)
WBC: 11.8 10*3/uL — ABNORMAL HIGH (ref 4.0–10.5)
nRBC: 0 % (ref 0.0–0.2)

## 2020-11-13 NOTE — ED Triage Notes (Signed)
Abdominal pain and vomiting x 2 episodes started last night. hx of gall stones.

## 2020-11-13 NOTE — ED Notes (Signed)
Pt did not want to wait and left walked of the lobby and has not returned.

## 2021-03-27 ENCOUNTER — Encounter (HOSPITAL_COMMUNITY): Payer: Self-pay

## 2021-03-27 ENCOUNTER — Emergency Department (HOSPITAL_COMMUNITY): Payer: Self-pay

## 2021-03-27 ENCOUNTER — Emergency Department (HOSPITAL_COMMUNITY)
Admission: EM | Admit: 2021-03-27 | Discharge: 2021-03-27 | Disposition: A | Discharge status: Home / Self Care | Payer: Self-pay | Attending: Emergency Medicine | Admitting: Emergency Medicine

## 2021-03-27 ENCOUNTER — Other Ambulatory Visit: Payer: Self-pay

## 2021-03-27 DIAGNOSIS — N9489 Other specified conditions associated with female genital organs and menstrual cycle: Secondary | ICD-10-CM | POA: Insufficient documentation

## 2021-03-27 DIAGNOSIS — U071 COVID-19: Secondary | ICD-10-CM

## 2021-03-27 DIAGNOSIS — F17210 Nicotine dependence, cigarettes, uncomplicated: Secondary | ICD-10-CM | POA: Insufficient documentation

## 2021-03-27 DIAGNOSIS — R1084 Generalized abdominal pain: Secondary | ICD-10-CM | POA: Insufficient documentation

## 2021-03-27 DIAGNOSIS — B354 Tinea corporis: Secondary | ICD-10-CM

## 2021-03-27 DIAGNOSIS — R300 Dysuria: Secondary | ICD-10-CM

## 2021-03-27 DIAGNOSIS — R112 Nausea with vomiting, unspecified: Secondary | ICD-10-CM | POA: Insufficient documentation

## 2021-03-27 LAB — COMPREHENSIVE METABOLIC PANEL
ALT: 9 U/L (ref 0–44)
AST: 15 U/L (ref 15–41)
Albumin: 3.1 g/dL — ABNORMAL LOW (ref 3.5–5.0)
Alkaline Phosphatase: 105 U/L (ref 38–126)
Anion gap: 6 (ref 5–15)
BUN: <5 mg/dL — ABNORMAL LOW (ref 6–20)
CO2: 24 mmol/L (ref 22–32)
Calcium: 8 mg/dL — ABNORMAL LOW (ref 8.9–10.3)
Chloride: 104 mmol/L (ref 98–111)
Creatinine, Ser: 0.57 mg/dL (ref 0.44–1.00)
GFR, Estimated: >60 mL/min (ref >60)
Glucose, Bld: 98 mg/dL (ref 70–99)
Potassium: 3.3 mmol/L — ABNORMAL LOW (ref 3.5–5.1)
Sodium: 134 mmol/L — ABNORMAL LOW (ref 135–145)
Total Bilirubin: 0.4 mg/dL (ref 0.3–1.2)
Total Protein: 6.5 g/dL (ref 6.5–8.1)

## 2021-03-27 LAB — URINALYSIS, ROUTINE W REFLEX MICROSCOPIC
Bilirubin Urine: NEGATIVE
Glucose, UA: NEGATIVE mg/dL
Ketones, ur: NEGATIVE mg/dL
Nitrite: NEGATIVE
Protein, ur: NEGATIVE mg/dL
Specific Gravity, Urine: 1.02 (ref 1.005–1.030)
pH: 6 (ref 5.0–8.0)

## 2021-03-27 LAB — CBC WITH DIFFERENTIAL/PLATELET
Abs Immature Granulocytes: 0.02 10*3/uL (ref 0.00–0.07)
Basophils Absolute: 0 10*3/uL (ref 0.0–0.1)
Basophils Relative: 0 %
Eosinophils Absolute: 0.1 10*3/uL (ref 0.0–0.5)
Eosinophils Relative: 1 %
HCT: 30.7 % — ABNORMAL LOW (ref 36.0–46.0)
Hemoglobin: 9.2 g/dL — ABNORMAL LOW (ref 12.0–15.0)
Immature Granulocytes: 0 %
Lymphocytes Relative: 10 %
Lymphs Abs: 0.5 10*3/uL — ABNORMAL LOW (ref 0.7–4.0)
MCH: 24 pg — ABNORMAL LOW (ref 26.0–34.0)
MCHC: 30 g/dL (ref 30.0–36.0)
MCV: 79.9 fL — ABNORMAL LOW (ref 80.0–100.0)
Monocytes Absolute: 1 10*3/uL (ref 0.1–1.0)
Monocytes Relative: 18 %
Neutro Abs: 3.9 10*3/uL (ref 1.7–7.7)
Neutrophils Relative %: 71 %
Platelets: 361 10*3/uL (ref 150–400)
RBC: 3.84 MIL/uL — ABNORMAL LOW (ref 3.87–5.11)
RDW: 18.1 % — ABNORMAL HIGH (ref 11.5–15.5)
WBC: 5.5 10*3/uL (ref 4.0–10.5)
nRBC: 0 % (ref 0.0–0.2)

## 2021-03-27 LAB — RESP PANEL BY RT-PCR (FLU A&B, COVID) ARPGX2
Influenza A by PCR: NEGATIVE
Influenza B by PCR: NEGATIVE
SARS Coronavirus 2 by RT PCR: POSITIVE — AB

## 2021-03-27 LAB — I-STAT BETA HCG BLOOD, ED (MC, WL, AP ONLY): I-stat hCG, quantitative: <5 m[IU]/mL (ref <5)

## 2021-03-27 LAB — LIPASE, BLOOD: Lipase: 26 U/L (ref 11–51)

## 2021-03-27 MED ORDER — POTASSIUM CHLORIDE CRYS ER 20 MEQ PO TBCR
40.0000 meq | EXTENDED_RELEASE_TABLET | Freq: Once | ORAL | Status: AC
  Administered 2021-03-27: 40 meq via ORAL
  Filled 2021-03-27: qty 2

## 2021-03-27 MED ORDER — ONDANSETRON 4 MG PO TBDP: 4.0000 mg | ORAL_TABLET | Freq: Three times a day (TID) | ORAL | 0 refills | Status: AC | PRN

## 2021-03-27 MED ORDER — CLOTRIMAZOLE 1 % EX CREA: TOPICAL_CREAM | CUTANEOUS | 0 refills | Status: AC

## 2021-03-27 MED ORDER — SODIUM CHLORIDE 0.9 % IV BOLUS
1000.0000 mL | Freq: Once | INTRAVENOUS | Status: AC
  Administered 2021-03-27: 1000 mL via INTRAVENOUS

## 2021-03-27 MED ORDER — NITROFURANTOIN MONOHYD MACRO 100 MG PO CAPS: 100.0000 mg | ORAL_CAPSULE | Freq: Two times a day (BID) | ORAL | 0 refills | Status: AC

## 2021-03-27 MED ORDER — ONDANSETRON HCL 4 MG/2ML IJ SOLN
4.0000 mg | Freq: Once | INTRAMUSCULAR | Status: AC
  Administered 2021-03-27: 4 mg via INTRAVENOUS
  Filled 2021-03-27: qty 2

## 2021-03-27 MED ORDER — KETOROLAC TROMETHAMINE 30 MG/ML IJ SOLN
30.0000 mg | Freq: Once | INTRAMUSCULAR | Status: AC
  Administered 2021-03-27: 30 mg via INTRAVENOUS
  Filled 2021-03-27: qty 1

## 2021-03-27 NOTE — Discharge Instructions (Addendum)
,      You were evaluated in the Emergency Department and after careful evaluation, we did not find any emergent condition requiring admission or further testing in the hospital.  Your COVID test is positive.   Your exam/testing today was overall reassuring.  Symptoms seem to be due to COVID and a UTI.  I want you to follow-up with your primary care doctor in the next couple of days, if you do not have 1 you can go to the post COVID care clinic or to community health and wellness, their information as above.  I want to take Tylenol instructed on bottle for pain, I also prescribed you some nausea medication which you can take if you start to feel nauseous.  Make sure to stay hydrated, please follow CDC guidelines and isolate for the next 5 days.  If you develop shortness of breath, worsening pain, or other new or worsening concerning symptoms please come back to the emergency department.  Your hemoglobin was low, I want you to have this evaluated with your PCP as well, it appears as if it is normally low, please start taking a multivitamin with iron.  I also prescribed you some antifungal medication have this area reevaluated in the next couple of weeks. Please return to the Emergency Department if you experience any worsening of your condition.  Thank you for allowing [place] to be a part of your care. Please speak to your pharmacist about any new medications prescribed today in regards to side effects or interactions with other medications.

## 2021-03-27 NOTE — ED Triage Notes (Signed)
Pt BIB EMS from home. Pt reports body aches, vomiting, and chills since last night.   BP 80/60 initially given NS and latest was 100/66 18G LAC

## 2021-03-27 NOTE — ED Notes (Signed)
Pt ambulatory to the bathroom w/o assistance and with steady gait 

## 2021-03-27 NOTE — ED Provider Notes (Signed)
[facility]-EMERGENCY DEPT Provider Note   CSN: [id number] Arrival date & time: 03/27/21  1701     History Chief Complaint  Patient presents with   Generalized Body Aches   Emesis    Olivia Mcdaniel is a 40 y.o. female with past medical history of polysubstance abuse, gallstones presents to the emergency department today for body aches, vomiting and chills since last night.  With EMS blood pressure was originally 80/60, was given 500 mL normal saline, last blood pressure 100/66.Marland Kitchen  Patient states that she started feeling ill last night with 3 rounds of vomiting up until this morning.  States that she feels nauseous, has not had an appetite since yesterday.  Patient states that she has generalized abdominal pain, worsened after she vomits.  Patient states this does not feel like her gallbladder.  Pain does not rate anywhere, also admits to generalized body pain including her extremities, chest.  States that her entire body hurts, has not taken anything for this.  Denies any fevers, however does admit to chills.  Patient states that she was recently exposed to her sister earlier this week that had COVID.  States that she has been vaccinated against COVID, however has not had a booster.  Denies any shortness of breath, diarrhea, congestion, sore throat, cough.  No cardiac history, patient states that she has not had any drug use since over a month.  Patient also does admit to some dysuria that started a couple days ago, hematuria, no vaginal symptoms.  HPI     Past Medical History:  Diagnosis Date   Drug abuse (HCC)    Gallstones    Medical history non-contributory    Ovarian cyst     Patient Active Problem List   Diagnosis Date Noted   Chest pain 07/24/2018   Unresponsive episode 07/24/2018   Polysubstance abuse (HCC) 07/01/2018   Substance induced mood disorder (HCC) 07/01/2018   Missed periods 12/28/2017   Left ovarian cyst 12/28/2017   Cholecystitis  06/17/2015    Past Surgical History:  Procedure Laterality Date   CHOLECYSTECTOMY  06/17/2015   CHOLECYSTECTOMY N/A 06/17/2015   Procedure: LAPAROSCOPIC CHOLECYSTECTOMY;  Surgeon: Emelia Loron, MD;  Location: [facility] OR;  Service: General;  Laterality: N/A;     OB History     Gravida  2   Para  2   Term  0   Preterm  2   AB      Living  2      SAB      IAB      Ectopic      Multiple      Live Births  2        Obstetric Comments  SVD at 36 wks. Not in the NICU. Delivered one in [place] and one at [facility] in [place]         Family History  Problem Relation Age of Onset   Asthma Mother    Miscarriages / [place] Mother    Learning disabilities Sister    Mental illness Sister    Mental retardation Sister     Social History   Tobacco Use   Smoking status: Every Day    Packs/day: 0.25    Years: 2.00    Pack years: 0.50    Types: Cigarettes   Smokeless tobacco: Never   Tobacco comments:    declines  Vaping Use   Vaping Use: Never used  Substance Use Topics   Alcohol use: Not  Currently    Alcohol/week: 2.0 standard drinks    Types: 2 Glasses of wine per week   Drug use: Not Currently    Types: Cocaine    Home Medications Prior to Admission medications   Medication Sig Start Date End Date Taking? Authorizing Provider  clotrimazole (LOTRIMIN) 1 % cream Apply to affected area 2 times daily for 4 weeks 03/27/21  Yes Farrel Gordon, PA-C  nitrofurantoin, macrocrystal-monohydrate, (MACROBID) 100 MG capsule Take 1 capsule (100 mg total) by mouth 2 (two) times daily. 03/27/21  Yes Farrel Gordon, PA-C  ondansetron (ZOFRAN ODT) 4 MG disintegrating tablet Take 1 tablet (4 mg total) by mouth every 8 (eight) hours as needed for nausea or vomiting. 03/27/21  Yes Farrel Gordon, PA-C  albuterol (PROVENTIL HFA;VENTOLIN HFA) 108 (90 BASE) MCG/ACT inhaler Inhale 2 puffs into the lungs every 6 (six) hours as needed for wheezing. For shortness of  breath Patient not taking: Reported on 11/26/2017 10/15/12   Johnnette Gourd, MD  docusate sodium (COLACE) 100 MG capsule Take 2 tablets daily until you have a BM, then 1 tablet daily until stool is soft. Patient not taking: Reported on 03/10/2020 01/17/20   Roxy Horseman, PA-C  HYDROcodone-acetaminophen (NORCO/VICODIN) 5-325 MG tablet Take 1 tablet by mouth every 4 (four) hours as needed. Patient not taking: Reported on 01/15/2019 09/29/18   Jacalyn Lefevre, MD  ibuprofen (ADVIL) 600 MG tablet Take 1 tablet (600 mg total) by mouth every 6 (six) hours as needed. Patient not taking: Reported on 03/10/2020 01/15/19   Elpidio Anis, PA-C  polyethylene glycol powder (GLYCOLAX/MIRALAX) 17 GM/SCOOP powder Take 17 g by mouth 2 (two) times daily. Patient not taking: Reported on 03/10/2020 01/17/20   Roxy Horseman, PA-C  promethazine (PHENERGAN) 25 MG tablet Take 1 tablet (25 mg total) by mouth every 6 (six) hours as needed for nausea or vomiting. Patient not taking: Reported on 01/15/2019 09/29/18   Jacalyn Lefevre, MD    Allergies    Penicillins and Morphine and related  Review of Systems   Review of Systems  Constitutional:  Positive for chills. Negative for diaphoresis, fatigue and fever.  HENT:  Negative for congestion, sore throat and trouble swallowing.   Eyes:  Negative for pain and visual disturbance.  Respiratory:  Negative for cough, shortness of breath and wheezing.   Cardiovascular:  Negative for chest pain, palpitations and leg swelling.  Gastrointestinal:  Positive for abdominal pain, nausea and vomiting. Negative for abdominal distention and diarrhea.  Genitourinary:  Negative for difficulty urinating.  Musculoskeletal:  Positive for arthralgias. Negative for back pain, neck pain and neck stiffness.  Skin:  Negative for pallor.  Neurological:  Negative for dizziness, speech difficulty, weakness and headaches.  Psychiatric/Behavioral:  Negative for confusion.    Physical Exam Updated Vital  Signs BP (!) 103/59   Pulse 80   Temp 98.3 F (36.8 C) (Oral)   Resp (!) 21   SpO2 99%   Physical Exam Constitutional:      General: She is in acute distress.     Appearance: Normal appearance. She is not ill-appearing, toxic-appearing or diaphoretic.     Comments: SickPatient is crying, fetal position, nontoxic-appearing.  HENT:     Mouth/Throat:     Mouth: Mucous membranes are moist.     Pharynx: Oropharynx is clear.  Eyes:     General: No scleral icterus.    Extraocular Movements: Extraocular movements intact.     Pupils: Pupils are equal, round, and reactive to light.  Cardiovascular:  Rate and Rhythm: Normal rate and regular rhythm.     Pulses: Normal pulses.     Heart sounds: Normal heart sounds.  Pulmonary:     Effort: Pulmonary effort is normal. No respiratory distress.     Breath sounds: Normal breath sounds. No stridor. No wheezing, rhonchi or rales.  Chest:     Chest wall: No tenderness.  Abdominal:     General: Abdomen is flat. There is no distension.     Palpations: Abdomen is soft.     Tenderness: There is abdominal tenderness. There is no right CVA tenderness, left CVA tenderness, guarding or rebound.     Comments: Generalized abdominal pain. NO CVA tenderness.   Musculoskeletal:        General: No swelling or tenderness. Normal range of motion.     Cervical back: Normal range of motion and neck supple. No rigidity.     Right lower leg: No edema.     Left lower leg: No edema.  Skin:    General: Skin is warm and dry.     Capillary Refill: Capillary refill takes less than 2 seconds.     Coloration: Skin is not pale.     Comments: Circular lesion  about 1 cm in diameter with rim near forearm on L side. Appears like tinea corporis. Does not appear infected  Neurological:     General: No focal deficit present.     Mental Status: She is alert and oriented to person, place, and time.  Psychiatric:        Mood and Affect: Mood normal.        Behavior:  Behavior normal.    ED Results / Procedures / Treatments   Labs (all labs ordered are listed, but only abnormal results are displayed) Labs Reviewed  RESP PANEL BY RT-PCR (FLU A&B, COVID) ARPGX2 - Abnormal; Notable for the following components:      Result Value   SARS Coronavirus 2 by RT PCR POSITIVE (*)    All other components within normal limits  COMPREHENSIVE METABOLIC PANEL - Abnormal; Notable for the following components:   Sodium 134 (*)    Potassium 3.3 (*)    BUN <5 (*)    Calcium 8.0 (*)    Albumin 3.1 (*)    All other components within normal limits  CBC WITH DIFFERENTIAL/PLATELET - Abnormal; Notable for the following components:   RBC 3.84 (*)    Hemoglobin 9.2 (*)    HCT 30.7 (*)    MCV 79.9 (*)    MCH 24.0 (*)    RDW 18.1 (*)    Lymphs Abs 0.5 (*)    All other components within normal limits  URINALYSIS, ROUTINE W REFLEX MICROSCOPIC - Abnormal; Notable for the following components:   APPearance HAZY (*)    Hgb urine dipstick MODERATE (*)    Leukocytes,Ua SMEAR ONLY (*)    Bacteria, UA MANY (*)    All other components within normal limits  URINE CULTURE  LIPASE, BLOOD  I-STAT BETA HCG BLOOD, ED ([facility], [facility], [facility] ONLY)    EKG None  Radiology DG Chest Port 1 View  Result Date: 03/27/2021 CLINICAL DATA:  Chest pain. EXAM: PORTABLE CHEST 1 VIEW COMPARISON:  March 10, 2020. FINDINGS: The heart size and mediastinal contours are within normal limits. Both lungs are clear. The visualized skeletal structures are unremarkable. IMPRESSION: No active disease. Electronically Signed   By: Lupita RaiderJames  Green Jr M.D.   On: 03/27/2021 18:02  Procedures Procedures   Medications Ordered in ED Medications  sodium chloride 0.9 % bolus 1,000 mL (0 mLs Intravenous Stopped 03/27/21 1934)  ondansetron (ZOFRAN) injection 4 mg (4 mg Intravenous Given 03/27/21 1742)  ketorolac (TORADOL) 30 MG/ML injection 30 mg (30 mg Intravenous Given 03/27/21 1934)  potassium chloride SA (KLOR-CON) CR  tablet 40 mEq (40 mEq Oral Given 03/27/21 2214)    ED Course  I have reviewed the triage vital signs and the nursing notes.  Pertinent labs & imaging results that were available during my care of the patient were reviewed by me and considered in my medical decision making (see chart for details).    MDM Rules/Calculators/A&P                           Olivia Mcdaniel is a 40 y.o. female with past medical history of polysubstance abuse, gallstones presents to the emergency department today for body aches, vomiting and chills since last night with recent exposure to COVID.  Patient is nontoxic-appearing, will give fluids and Zofran at this time and obtain basic labs and reevaluate.  Skin exam also appears as if patient has area of tinea corporis, will give antifungal cream for this and have patient reevaluate.  Area does not appear infected, distally neurovascularly intact.  Labs today show COVID-positive.  CMP with potassium of 3.3, repleted here today.  CBC with hemoglobin of 9.2, this appears to be patient's baseline.  States that she has had heavy periods her whole life, will have this checked with her PCP.  Urinalysis does show some signs of infection, patient does not have any CVA tenderness.  Patient is not having any radiating back pain to suggest renal colic, I think arthralgias and myalgias are from COVID, however since patient is having some dysuria therefore will obtain urine culture and placed on antibiotics for this.  Upon reevaluation, patient states that she feels much better.  Passed p.o. challenge.  Patient does not have any indication for antivirals for COVID at this time.  To be discharged, will follow up with PCP.  Doubt need for further emergent work up at this time. I explained the diagnosis and have given explicit precautions to return to the ER including for any other new or worsening symptoms. The patient understands and accepts the medical plan as it's been dictated and I  have answered their questions. Discharge instructions concerning home care and prescriptions have been given. The patient is STABLE and is discharged to home in good condition.  Final Clinical Impression(s) / ED Diagnoses Final diagnoses:  COVID  Dysuria  Tinea corporis    Rx / DC Orders ED Discharge Orders          Ordered    nitrofurantoin, macrocrystal-monohydrate, (MACROBID) 100 MG capsule  2 times daily        03/27/21 2159    ondansetron (ZOFRAN ODT) 4 MG disintegrating tablet  Every 8 hours PRN        03/27/21 2159    clotrimazole (LOTRIMIN) 1 % cream        03/27/21 2247             Farrel Gordon, PA-C 03/27/21 2257    Tilden Fossa, MD 03/28/21 0005

## 2021-03-30 LAB — URINE CULTURE: Culture: >=100000 — AB

## 2021-03-31 ENCOUNTER — Telehealth: Payer: Self-pay | Admitting: Emergency Medicine

## 2021-03-31 NOTE — Telephone Encounter (Signed)
Post ED Visit - Positive Culture Follow-up  Culture report reviewed by antimicrobial stewardship pharmacist: [facility] []  , Pharm.D. []  Enzo Bi, Pharm.D., BCPS AQ-ID []  , Pharm.D., BCPS []  Celedonio Miyamoto, Pharm.D., BCPS []  Garvin, Garvin Fila.D., BCPS, AAHIVP []  , Pharm.D., BCPS, AAHIVP []  Georgina Pillion, PharmD, BCPS []  , PharmD, BCPS []  [place], PharmD, BCPS []  [address], PharmD []  , PharmD, BCPS []  Estella Husk, PharmD  Pharmacy Team []  Lysle Pearl, PharmD []  , PharmD []  Phillips Climes, PharmD []  , Rph []  Agapito Games) , PharmD []  Verlan Friends, PharmD []  , PharmD []  Mervyn Gay, PharmD []  , PharmD []  Vinnie Level, PharmD []  [facility], PharmD []  , PharmD []  Len Childs, PharmD   Positive urine culture Treated with nitrofurantoin, organism sensitive to the same and no further patient follow-up is required at this time.  03/31/2021, 12:36 PM

## 2022-01-19 ENCOUNTER — Emergency Department (HOSPITAL_COMMUNITY)
Admission: EM | Admit: 2022-01-19 | Discharge: 2022-01-19 | Disposition: A | Discharge status: Home / Self Care | Payer: Self-pay | Attending: Emergency Medicine | Admitting: Emergency Medicine

## 2022-01-19 ENCOUNTER — Encounter (HOSPITAL_COMMUNITY): Payer: Self-pay | Admitting: Radiology

## 2022-01-19 ENCOUNTER — Other Ambulatory Visit: Payer: Self-pay

## 2022-01-19 DIAGNOSIS — N390 Urinary tract infection, site not specified: Secondary | ICD-10-CM | POA: Insufficient documentation

## 2022-01-19 DIAGNOSIS — F17210 Nicotine dependence, cigarettes, uncomplicated: Secondary | ICD-10-CM | POA: Insufficient documentation

## 2022-01-19 DIAGNOSIS — N9489 Other specified conditions associated with female genital organs and menstrual cycle: Secondary | ICD-10-CM | POA: Insufficient documentation

## 2022-01-19 LAB — CBC WITH DIFFERENTIAL/PLATELET
Abs Immature Granulocytes: 0.03 10*3/uL (ref 0.00–0.07)
Basophils Absolute: 0.1 10*3/uL (ref 0.0–0.1)
Basophils Relative: 1 %
Eosinophils Absolute: 0.4 10*3/uL (ref 0.0–0.5)
Eosinophils Relative: 4 %
HCT: 35.4 % — ABNORMAL LOW (ref 36.0–46.0)
Hemoglobin: 10.6 g/dL — ABNORMAL LOW (ref 12.0–15.0)
Immature Granulocytes: 0 %
Lymphocytes Relative: 24 %
Lymphs Abs: 2.6 10*3/uL (ref 0.7–4.0)
MCH: 23.3 pg — ABNORMAL LOW (ref 26.0–34.0)
MCHC: 29.9 g/dL — ABNORMAL LOW (ref 30.0–36.0)
MCV: 77.8 fL — ABNORMAL LOW (ref 80.0–100.0)
Monocytes Absolute: 1.2 10*3/uL — ABNORMAL HIGH (ref 0.1–1.0)
Monocytes Relative: 11 %
Neutro Abs: 6.5 10*3/uL (ref 1.7–7.7)
Neutrophils Relative %: 60 %
Platelets: 527 10*3/uL — ABNORMAL HIGH (ref 150–400)
RBC: 4.55 MIL/uL (ref 3.87–5.11)
RDW: 19.3 % — ABNORMAL HIGH (ref 11.5–15.5)
WBC: 10.8 10*3/uL — ABNORMAL HIGH (ref 4.0–10.5)
nRBC: 0 % (ref 0.0–0.2)

## 2022-01-19 LAB — URINALYSIS, ROUTINE W REFLEX MICROSCOPIC
Bilirubin Urine: NEGATIVE
Glucose, UA: NEGATIVE mg/dL
Ketones, ur: NEGATIVE mg/dL
Nitrite: NEGATIVE
Protein, ur: NEGATIVE mg/dL
Specific Gravity, Urine: 1.01 (ref 1.005–1.030)
WBC, UA: >50 WBC/hpf — ABNORMAL HIGH (ref 0–5)
pH: 5 (ref 5.0–8.0)

## 2022-01-19 LAB — I-STAT BETA HCG BLOOD, ED (MC, WL, AP ONLY): I-stat hCG, quantitative: <5 m[IU]/mL (ref <5)

## 2022-01-19 LAB — COMPREHENSIVE METABOLIC PANEL
ALT: 14 U/L (ref 0–44)
AST: 17 U/L (ref 15–41)
Albumin: 3.7 g/dL (ref 3.5–5.0)
Alkaline Phosphatase: 115 U/L (ref 38–126)
Anion gap: 8 (ref 5–15)
BUN: 9 mg/dL (ref 6–20)
CO2: 24 mmol/L (ref 22–32)
Calcium: 8.5 mg/dL — ABNORMAL LOW (ref 8.9–10.3)
Chloride: 104 mmol/L (ref 98–111)
Creatinine, Ser: 0.61 mg/dL (ref 0.44–1.00)
GFR, Estimated: >60 mL/min (ref >60)
Glucose, Bld: 96 mg/dL (ref 70–99)
Potassium: 3.6 mmol/L (ref 3.5–5.1)
Sodium: 136 mmol/L (ref 135–145)
Total Bilirubin: 0.2 mg/dL — ABNORMAL LOW (ref 0.3–1.2)
Total Protein: 7.8 g/dL (ref 6.5–8.1)

## 2022-01-19 LAB — LIPASE, BLOOD: Lipase: 39 U/L (ref 11–51)

## 2022-01-19 MED ORDER — SULFAMETHOXAZOLE-TRIMETHOPRIM 800-160 MG PO TABS: 1.0000 | ORAL_TABLET | Freq: Two times a day (BID) | ORAL | 0 refills | Status: AC

## 2022-01-19 MED ORDER — NAPROXEN 500 MG PO TABS
500.0000 mg | ORAL_TABLET | Freq: Once | ORAL | Status: AC
  Administered 2022-01-19: 500 mg via ORAL
  Filled 2022-01-19: qty 1

## 2022-01-19 NOTE — ED Triage Notes (Signed)
Pt picked up from [facility] with complaints of midline abd pain that also goes to her lower back. Pt states she has had ovarian cysts in the past and believes this pain to be from them. ?

## 2022-01-19 NOTE — ED Provider Notes (Signed)
?[facility]-EMERGENCY DEPT ?[facility] Emergency Department ?Provider Note ?MRN:  [id number]  ?Arrival date & time: 01/19/22    ? ?Chief Complaint   ?Abdominal Pain ?  ?History of Present Illness   ?Olivia Mcdaniel is a 41 y.o. year-old female with a history of drug use disorder presenting to the ED with chief complaint of abdominal pain. ? ?Lower abdominal pain with vaginal bleeding over the past day or 2.  No fever, no chest pain or shortness of breath. ? ?Review of Systems  ?A thorough review of systems was obtained and all systems are negative except as noted in the HPI and PMH.  ? ?Patient's Health History   ? ?Past Medical History:  ?Diagnosis Date  ? Drug abuse (HCC)   ? Gallstones   ? Medical history non-contributory   ? Ovarian cyst   ?  ?Past Surgical History:  ?Procedure Laterality Date  ? CHOLECYSTECTOMY  06/17/2015  ? CHOLECYSTECTOMY N/A 06/17/2015  ? Procedure: LAPAROSCOPIC CHOLECYSTECTOMY;  Surgeon: Emelia Loron, MD;  Location: [facility] OR;  Service: General;  Laterality: N/A;  ?  ?Family History  ?Problem Relation Age of Onset  ? Asthma Mother   ? Miscarriages / [place] Mother   ? Learning disabilities Sister   ? Mental illness Sister   ? Mental retardation Sister   ?  ?Social History  ? ?Socioeconomic History  ? Marital status: Single  ?  Spouse name: Not on file  ? Number of children: Not on file  ? Years of education: Not on file  ? Highest education level: Not on file  ?Occupational History  ? Not on file  ?Tobacco Use  ? Smoking status: Every Day  ?  Packs/day: 0.25  ?  Years: 2.00  ?  Pack years: 0.50  ?  Types: Cigarettes  ? Smokeless tobacco: Never  ? Tobacco comments:  ?  declines  ?Vaping Use  ? Vaping Use: Never used  ?Substance and Sexual Activity  ? Alcohol use: Not Currently  ?  Alcohol/week: 2.0 standard drinks  ?  Types: 2 Glasses of wine per week  ? Drug use: Not Currently  ?  Types: Cocaine  ? Sexual activity: Yes  ?  Birth control/protection: Pill  ?Other Topics Concern  ?  Not on file  ?Social History Narrative  ? Not on file  ? ?Social Determinants of Health  ? ?Financial Resource Strain: Not on file  ?Food Insecurity: Not on file  ?Transportation Needs: Not on file  ?Physical Activity: Not on file  ?Stress: Not on file  ?Social Connections: Not on file  ?Intimate Partner Violence: Not on file  ?  ? ?Physical Exam  ? ?Vitals:  ? 01/19/22 0100 01/19/22 0200  ?BP: 106/69 101/62  ?Pulse: 87 95  ?Resp:  18  ?Temp: 98.8 ?F (37.1 ?C)   ?SpO2: 97% 98%  ?  ?CONSTITUTIONAL: Well-appearing, NAD ?NEURO/PSYCH:  Alert and oriented x 3, no focal deficits ?EYES:  eyes equal and reactive ?ENT/NECK:  no LAD, no JVD ?CARDIO: Regular rate, well-perfused, normal S1 and S2 ?PULM:  CTAB no wheezing or rhonchi ?GI/GU:  non-distended, non-tender ?MSK/SPINE:  No gross deformities, no edema ?SKIN:  no rash, atraumatic ? ? ?*Additional and/or pertinent findings included in MDM below ? ?Diagnostic and Interventional Summary  ? ? EKG Interpretation ? ?Date/Time:    ?Ventricular Rate:    ?PR Interval:    ?QRS Duration:   ?QT Interval:    ?QTC Calculation:   ?R Axis:     ?  Text Interpretation:   ?  ? ?  ? ?Labs Reviewed  ?CBC WITH DIFFERENTIAL/PLATELET - Abnormal; Notable for the following components:  ?    Result Value  ? WBC 10.8 (*)   ? Hemoglobin 10.6 (*)   ? HCT 35.4 (*)   ? MCV 77.8 (*)   ? MCH 23.3 (*)   ? MCHC 29.9 (*)   ? RDW 19.3 (*)   ? Platelets 527 (*)   ? Monocytes Absolute 1.2 (*)   ? All other components within normal limits  ?COMPREHENSIVE METABOLIC PANEL - Abnormal; Notable for the following components:  ? Calcium 8.5 (*)   ? Total Bilirubin 0.2 (*)   ? All other components within normal limits  ?URINALYSIS, ROUTINE W REFLEX MICROSCOPIC - Abnormal; Notable for the following components:  ? APPearance HAZY (*)   ? Hgb urine dipstick LARGE (*)   ? Leukocytes,Ua LARGE (*)   ? WBC, UA >50 (*)   ? Bacteria, UA MANY (*)   ? All other components within normal limits  ?LIPASE, BLOOD  ?I-STAT BETA HCG  BLOOD, ED ([facility], [facility], [facility] ONLY)  ?  ?No orders to display  ?  ?Medications  ?naproxen (NAPROSYN) tablet 500 mg (500 mg Oral Given 01/19/22 0146)  ?  ? ?Procedures  /  Critical Care ?Procedures ? ?ED Course and Medical Decision Making  ?Initial Impression and Ddx ?Vaginal bleeding, lower abdominal pain.  DDx includes ectopic pregnancy, abnormal uterine bleeding.  Low concern for appendicitis or PID given the lack of abdominal tenderness.  Vital signs reassuring, no fever.  hCG is negative, labs generally normal with no significant blood count or electrolyte disturbance.  Urinalysis showing evidence of infection.  Appropriate for discharge on antibiotics. ? ?Past medical/surgical history that increases complexity of ED encounter: None ? ?Interpretation of Diagnostics ?I personally reviewed the laboratory assessment and my interpretation is as follows: See above for details ?   ? ? ?Patient Reassessment and Ultimate Disposition/Management ?Discharge home ? ?Patient management required discussion with the following services or consulting groups:  None ? ?Complexity of Problems Addressed ?Acute illness or injury that poses threat of life of bodily function ? ?Additional Data Reviewed and Analyzed ?Further history obtained from: ?Further history from spouse/family member ? ?Additional Factors Impacting ED Encounter Risk ?Prescriptions ? ?Elmer Sow. Pilar Plate, MD ?[facility] Emergency Medicine ?[facility] [email or web address] ? ?Final Clinical Impressions(s) / ED Diagnoses  ? ?  ICD-10-CM   ?1. Lower urinary tract infectious disease  N39.0   ?  ?  ?ED Discharge Orders   ? ?      Ordered  ?  sulfamethoxazole-trimethoprim (BACTRIM DS) 800-160 MG tablet  2 times daily       ? 01/19/22 0213  ? ?  ?  ? ?  ?  ? ?Discharge Instructions Discussed with and Provided to Patient:  ? ? ?Discharge Instructions   ? ?  ?You were evaluated in the Emergency Department and after careful evaluation, we did not find any emergent  condition requiring admission or further testing in the hospital. ? ?Your exam/testing today was overall reassuring.  Blood tests show that you have not lost a dangerous amount of blood.  Your urine sample is showing signs of infection.  Please take the Bactrim antibiotic as directed.  Use Tylenol or Motrin for discomfort regarding her vaginal bleeding, recommend follow-up with a GYN doctor. ? ?Please return to the Emergency Department if you experience any worsening of your condition.  Thank you for allowing us to be a part of your care. ? ? ? ? ?  ?Sabas SousBero, Indigo Barbian M, MD ?01/19/22 0215 ? ?

## 2022-01-19 NOTE — Discharge Instructions (Signed)
You were evaluated in the Emergency Department and after careful evaluation, we did not find any emergent condition requiring admission or further testing in the hospital. ? ?Your exam/testing today was overall reassuring.  Blood tests show that you have not lost a dangerous amount of blood.  Your urine sample is showing signs of infection.  Please take the Bactrim antibiotic as directed.  Use Tylenol or Motrin for discomfort regarding her vaginal bleeding, recommend follow-up with a GYN doctor. ? ?Please return to the Emergency Department if you experience any worsening of your condition.  Thank you for allowing [place] to be a part of your care. ? ?

## 2022-03-23 ENCOUNTER — Emergency Department (HOSPITAL_COMMUNITY)
Admission: EM | Admit: 2022-03-23 | Discharge: 2022-03-23 | Discharge status: Against Medical Advice | Payer: Self-pay | Attending: Emergency Medicine | Admitting: Emergency Medicine

## 2022-03-23 ENCOUNTER — Encounter (HOSPITAL_COMMUNITY): Payer: Self-pay

## 2022-03-23 ENCOUNTER — Other Ambulatory Visit: Payer: Self-pay

## 2022-03-23 DIAGNOSIS — R112 Nausea with vomiting, unspecified: Secondary | ICD-10-CM | POA: Insufficient documentation

## 2022-03-23 DIAGNOSIS — Z5321 Procedure and treatment not carried out due to patient leaving prior to being seen by health care provider: Secondary | ICD-10-CM | POA: Insufficient documentation

## 2022-03-23 DIAGNOSIS — R509 Fever, unspecified: Secondary | ICD-10-CM | POA: Insufficient documentation

## 2022-03-23 DIAGNOSIS — R1031 Right lower quadrant pain: Secondary | ICD-10-CM | POA: Insufficient documentation

## 2022-03-23 LAB — CBC
HCT: 34.4 % — ABNORMAL LOW (ref 36.0–46.0)
Hemoglobin: 10.6 g/dL — ABNORMAL LOW (ref 12.0–15.0)
MCH: 24.4 pg — ABNORMAL LOW (ref 26.0–34.0)
MCHC: 30.8 g/dL (ref 30.0–36.0)
MCV: 79.1 fL — ABNORMAL LOW (ref 80.0–100.0)
Platelets: 418 10*3/uL — ABNORMAL HIGH (ref 150–400)
RBC: 4.35 MIL/uL (ref 3.87–5.11)
RDW: 19.5 % — ABNORMAL HIGH (ref 11.5–15.5)
WBC: 18.7 10*3/uL — ABNORMAL HIGH (ref 4.0–10.5)
nRBC: 0 % (ref 0.0–0.2)

## 2022-03-23 LAB — COMPREHENSIVE METABOLIC PANEL
ALT: 9 U/L (ref 0–44)
AST: 14 U/L — ABNORMAL LOW (ref 15–41)
Albumin: 3.5 g/dL (ref 3.5–5.0)
Alkaline Phosphatase: 106 U/L (ref 38–126)
Anion gap: 6 (ref 5–15)
BUN: 6 mg/dL (ref 6–20)
CO2: 25 mmol/L (ref 22–32)
Calcium: 8.4 mg/dL — ABNORMAL LOW (ref 8.9–10.3)
Chloride: 107 mmol/L (ref 98–111)
Creatinine, Ser: 0.8 mg/dL (ref 0.44–1.00)
GFR, Estimated: >60 mL/min (ref >60)
Glucose, Bld: 113 mg/dL — ABNORMAL HIGH (ref 70–99)
Potassium: 3.7 mmol/L (ref 3.5–5.1)
Sodium: 138 mmol/L (ref 135–145)
Total Bilirubin: 0.4 mg/dL (ref 0.3–1.2)
Total Protein: 7.5 g/dL (ref 6.5–8.1)

## 2022-03-23 LAB — HCG, QUANTITATIVE, PREGNANCY: hCG, Beta Chain, Quant, S: <1 m[IU]/mL (ref <5)

## 2022-03-23 LAB — LIPASE, BLOOD: Lipase: 26 U/L (ref 11–51)

## 2022-03-23 NOTE — ED Provider Triage Note (Signed)
Emergency Medicine Provider Triage Evaluation Note  CHRISSY EALEY , a 41 y.o. female  was evaluated in triage.  Pt complains of right lower quadrant abdominal pain.  Began yesterday.  States she has had some chills and feels like she has been running a fever however did not take her temperature.  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  Some nausea and vomiting.  No dysuria, hematuria.  States she has a prior diagnosis of ovarian cyst.  She is unsure if this feels similar.  She denies history of IV drug use.  Review of Systems  Positive: Rlq pain Negative:   Physical Exam  BP 107/62   Pulse 99   Temp 99.1 F (37.3 C) (Oral)   Resp 16   Ht 5\' 3"  (1.6 m)   SpO2 100%   BMI 19.49 kg/m  Gen:   Awake, no distress   Resp:  Normal effort  MSK:   Moves extremities without difficulty  Other:    Medical Decision Making  Medically screening exam initiated at 4:05 PM.  Appropriate orders placed.  RORI GOAR was informed that the remainder of the evaluation will be completed by another provider, this initial triage assessment does not replace that evaluation, and the importance of remaining in the ED until their evaluation is complete.  Rlq pain   Anjalee Cope A, PA-C 03/23/22 1607

## 2022-03-23 NOTE — ED Triage Notes (Signed)
Pt. BIB [facility] from home d/t RLQ pain. Pt. States she was diagnosed w/ ovarian cyst and think they may be effecting her.

## 2022-03-24 ENCOUNTER — Emergency Department (HOSPITAL_COMMUNITY): Payer: Self-pay

## 2022-03-24 ENCOUNTER — Other Ambulatory Visit: Payer: Self-pay

## 2022-03-24 ENCOUNTER — Emergency Department (HOSPITAL_COMMUNITY)
Admission: EM | Admit: 2022-03-24 | Discharge: 2022-03-25 | Discharge status: Against Medical Advice | Payer: Self-pay | Attending: Emergency Medicine | Admitting: Emergency Medicine

## 2022-03-24 DIAGNOSIS — Z5321 Procedure and treatment not carried out due to patient leaving prior to being seen by health care provider: Secondary | ICD-10-CM | POA: Insufficient documentation

## 2022-03-24 DIAGNOSIS — Z9049 Acquired absence of other specified parts of digestive tract: Secondary | ICD-10-CM | POA: Insufficient documentation

## 2022-03-24 DIAGNOSIS — R1031 Right lower quadrant pain: Secondary | ICD-10-CM | POA: Insufficient documentation

## 2022-03-24 DIAGNOSIS — R112 Nausea with vomiting, unspecified: Secondary | ICD-10-CM | POA: Insufficient documentation

## 2022-03-24 LAB — CBC WITH DIFFERENTIAL/PLATELET
Abs Immature Granulocytes: 0.04 10*3/uL (ref 0.00–0.07)
Basophils Absolute: 0 10*3/uL (ref 0.0–0.1)
Basophils Relative: 0 %
Eosinophils Absolute: 0 10*3/uL (ref 0.0–0.5)
Eosinophils Relative: 0 %
HCT: 30.7 % — ABNORMAL LOW (ref 36.0–46.0)
Hemoglobin: 9.6 g/dL — ABNORMAL LOW (ref 12.0–15.0)
Immature Granulocytes: 0 %
Lymphocytes Relative: 8 %
Lymphs Abs: 0.9 10*3/uL (ref 0.7–4.0)
MCH: 24.4 pg — ABNORMAL LOW (ref 26.0–34.0)
MCHC: 31.3 g/dL (ref 30.0–36.0)
MCV: 78.1 fL — ABNORMAL LOW (ref 80.0–100.0)
Monocytes Absolute: 1.9 10*3/uL — ABNORMAL HIGH (ref 0.1–1.0)
Monocytes Relative: 17 %
Neutro Abs: 8.6 10*3/uL — ABNORMAL HIGH (ref 1.7–7.7)
Neutrophils Relative %: 75 %
Platelets: 334 10*3/uL (ref 150–400)
RBC: 3.93 MIL/uL (ref 3.87–5.11)
RDW: 19.3 % — ABNORMAL HIGH (ref 11.5–15.5)
WBC: 11.5 10*3/uL — ABNORMAL HIGH (ref 4.0–10.5)
nRBC: 0 % (ref 0.0–0.2)

## 2022-03-24 LAB — COMPREHENSIVE METABOLIC PANEL
ALT: 7 U/L (ref 0–44)
AST: 12 U/L — ABNORMAL LOW (ref 15–41)
Albumin: 2.7 g/dL — ABNORMAL LOW (ref 3.5–5.0)
Alkaline Phosphatase: 97 U/L (ref 38–126)
Anion gap: 7 (ref 5–15)
BUN: 5 mg/dL — ABNORMAL LOW (ref 6–20)
CO2: 23 mmol/L (ref 22–32)
Calcium: 8.1 mg/dL — ABNORMAL LOW (ref 8.9–10.3)
Chloride: 105 mmol/L (ref 98–111)
Creatinine, Ser: 0.76 mg/dL (ref 0.44–1.00)
GFR, Estimated: >60 mL/min (ref >60)
Glucose, Bld: 99 mg/dL (ref 70–99)
Potassium: 3.4 mmol/L — ABNORMAL LOW (ref 3.5–5.1)
Sodium: 135 mmol/L (ref 135–145)
Total Bilirubin: 0.6 mg/dL (ref 0.3–1.2)
Total Protein: 6.5 g/dL (ref 6.5–8.1)

## 2022-03-24 LAB — URINALYSIS, ROUTINE W REFLEX MICROSCOPIC
Bilirubin Urine: NEGATIVE
Glucose, UA: NEGATIVE mg/dL
Ketones, ur: NEGATIVE mg/dL
Nitrite: POSITIVE — AB
Protein, ur: 30 mg/dL — AB
Specific Gravity, Urine: 1.014 (ref 1.005–1.030)
WBC, UA: >50 WBC/hpf — ABNORMAL HIGH (ref 0–5)
pH: 7 (ref 5.0–8.0)

## 2022-03-24 LAB — PREGNANCY, URINE: Preg Test, Ur: NEGATIVE

## 2022-03-24 MED ORDER — ONDANSETRON 4 MG PO TBDP
8.0000 mg | ORAL_TABLET | Freq: Once | ORAL | Status: AC
  Administered 2022-03-24: 8 mg via ORAL
  Filled 2022-03-24: qty 2

## 2022-03-24 MED ORDER — HYDROCODONE-ACETAMINOPHEN 5-325 MG PO TABS
1.0000 | ORAL_TABLET | Freq: Once | ORAL | Status: AC
  Administered 2022-03-24: 1 via ORAL
  Filled 2022-03-24: qty 1

## 2022-03-24 MED ORDER — IOHEXOL 300 MG/ML  SOLN
100.0000 mL | Freq: Once | INTRAMUSCULAR | Status: AC | PRN
  Administered 2022-03-24: 100 mL via INTRAVENOUS

## 2022-03-24 NOTE — ED Notes (Signed)
Patient came up to this writer stating her IV was coming out. IV was partially out. IV removed by this NT

## 2022-03-24 NOTE — ED Provider Triage Note (Signed)
Emergency Medicine Provider Triage Evaluation Note  Olivia Mcdaniel , a 41 y.o. female  was evaluated in triage.  Pt complains of right lower quadrant abdominal pain.  Patient states that symptoms been present since yesterday.  She describes the pain as sharp in nature.  She has associated nausea and vomiting.  She states she has a history of ovarian cysts and fears that this may be a complication of said symptoms.  Pain has been constant nature since onset.  She notes increased in pain with oral intake.  She was seen at [facility] long yesterday with laboratory studies drawn was never seen by provider.  She notes fever at home taken orally between 101 102 F.  Denies chest pain, shortness of breath, urinary symptoms, vaginal discharge, change in bowel habits.  Last menstrual period was approximately 6 months ago.  Patient states that she has very irregular periods and that she is currently spotting. Prior abdominal surgeries include cholecystectomy  Review of Systems  Positive: See above Negative:   Physical Exam  BP 94/62 (BP Location: Right Arm)   Pulse (!) 109   Temp 99 F (37.2 C) (Oral)   Resp 20   SpO2 100%  Gen:   Awake, no distress   Resp:  Normal effort  MSK:   Moves extremities without difficulty  Other:  Tender palpation right lower quadrant abdomen.  Medical Decision Making  Medically screening exam initiated at 8:55 PM.  Appropriate orders placed.  [name] was informed that the remainder of the evaluation will be completed by another provider, this initial triage assessment does not replace that evaluation, and the importance of remaining in the ED until their evaluation is complete.     Peter Garter, [place] 03/24/22 2059

## 2022-03-24 NOTE — ED Triage Notes (Signed)
Pt here via [facility] from home for abd pain that radiates to back and has had 2 episodes of vomiting today. Pt is warm to touch. Pt states she has an ovarian cyst.

## 2022-03-25 NOTE — ED Notes (Signed)
Patient called multiple times for vitals recheck with no response and not visible in the lobby

## 2022-03-25 NOTE — ED Notes (Signed)
PATIENT KEEP GOING IN & OUTSIDE CALL X3 FOR RECHECKED VITALSIGNS

## 2022-03-29 ENCOUNTER — Other Ambulatory Visit: Payer: Self-pay

## 2022-03-29 ENCOUNTER — Emergency Department (HOSPITAL_COMMUNITY)
Admission: EM | Admit: 2022-03-29 | Discharge: 2022-03-30 | Disposition: A | Discharge status: Home / Self Care | Payer: Self-pay | Attending: Emergency Medicine | Admitting: Emergency Medicine

## 2022-03-29 ENCOUNTER — Encounter (HOSPITAL_COMMUNITY): Payer: Self-pay

## 2022-03-29 ENCOUNTER — Emergency Department (HOSPITAL_COMMUNITY): Payer: Self-pay

## 2022-03-29 DIAGNOSIS — N390 Urinary tract infection, site not specified: Secondary | ICD-10-CM | POA: Insufficient documentation

## 2022-03-29 DIAGNOSIS — F17210 Nicotine dependence, cigarettes, uncomplicated: Secondary | ICD-10-CM | POA: Insufficient documentation

## 2022-03-29 LAB — COMPREHENSIVE METABOLIC PANEL
ALT: 10 U/L (ref 0–44)
AST: 11 U/L — ABNORMAL LOW (ref 15–41)
Albumin: 3.1 g/dL — ABNORMAL LOW (ref 3.5–5.0)
Alkaline Phosphatase: 79 U/L (ref 38–126)
Anion gap: 7 (ref 5–15)
BUN: 8 mg/dL (ref 6–20)
CO2: 25 mmol/L (ref 22–32)
Calcium: 8.2 mg/dL — ABNORMAL LOW (ref 8.9–10.3)
Chloride: 105 mmol/L (ref 98–111)
Creatinine, Ser: 0.61 mg/dL (ref 0.44–1.00)
GFR, Estimated: >60 mL/min (ref >60)
Glucose, Bld: 92 mg/dL (ref 70–99)
Potassium: 3.6 mmol/L (ref 3.5–5.1)
Sodium: 137 mmol/L (ref 135–145)
Total Bilirubin: 0.2 mg/dL — ABNORMAL LOW (ref 0.3–1.2)
Total Protein: 7.4 g/dL (ref 6.5–8.1)

## 2022-03-29 LAB — CBC WITH DIFFERENTIAL/PLATELET
Abs Immature Granulocytes: 0.06 10*3/uL (ref 0.00–0.07)
Basophils Absolute: 0 10*3/uL (ref 0.0–0.1)
Basophils Relative: 0 %
Eosinophils Absolute: 0.3 10*3/uL (ref 0.0–0.5)
Eosinophils Relative: 2 %
HCT: 32.5 % — ABNORMAL LOW (ref 36.0–46.0)
Hemoglobin: 9.7 g/dL — ABNORMAL LOW (ref 12.0–15.0)
Immature Granulocytes: 1 %
Lymphocytes Relative: 17 %
Lymphs Abs: 2.2 10*3/uL (ref 0.7–4.0)
MCH: 23.7 pg — ABNORMAL LOW (ref 26.0–34.0)
MCHC: 29.8 g/dL — ABNORMAL LOW (ref 30.0–36.0)
MCV: 79.5 fL — ABNORMAL LOW (ref 80.0–100.0)
Monocytes Absolute: 1 10*3/uL (ref 0.1–1.0)
Monocytes Relative: 8 %
Neutro Abs: 9.5 10*3/uL — ABNORMAL HIGH (ref 1.7–7.7)
Neutrophils Relative %: 72 %
Platelets: 546 10*3/uL — ABNORMAL HIGH (ref 150–400)
RBC: 4.09 MIL/uL (ref 3.87–5.11)
RDW: 19.8 % — ABNORMAL HIGH (ref 11.5–15.5)
WBC: 13 10*3/uL — ABNORMAL HIGH (ref 4.0–10.5)
nRBC: 0 % (ref 0.0–0.2)

## 2022-03-29 LAB — URINALYSIS, ROUTINE W REFLEX MICROSCOPIC
Bilirubin Urine: NEGATIVE
Glucose, UA: NEGATIVE mg/dL
Hgb urine dipstick: NEGATIVE
Ketones, ur: NEGATIVE mg/dL
Nitrite: NEGATIVE
Protein, ur: NEGATIVE mg/dL
Specific Gravity, Urine: 1.003 — ABNORMAL LOW (ref 1.005–1.030)
pH: 5 (ref 5.0–8.0)

## 2022-03-29 LAB — LIPASE, BLOOD: Lipase: 27 U/L (ref 11–51)

## 2022-03-29 LAB — I-STAT BETA HCG BLOOD, ED (MC, WL, AP ONLY): I-stat hCG, quantitative: <5 m[IU]/mL (ref <5)

## 2022-03-29 MED ORDER — IOHEXOL 300 MG/ML  SOLN
100.0000 mL | Freq: Once | INTRAMUSCULAR | Status: AC | PRN
  Administered 2022-03-29: 100 mL via INTRAVENOUS

## 2022-03-29 NOTE — ED Triage Notes (Signed)
Pt BIB EMS with reports of bilateral lower abdominal pain x 2 days.

## 2022-03-29 NOTE — ED Provider Triage Note (Signed)
Emergency Medicine Provider Triage Evaluation Note  Olivia Mcdaniel , a 41 y.o. female  was evaluated in triage.  Pt complains of right lower quadrant pain x2 weeks.  Patient states pain is constant, nonradiating.  She also has not fluctuating fevers and intermittent nausea.  She denies chest pain, shortness of breath, diarrhea, dysuria, hematuria.  Previous history of cholecystectomy.  Review of Systems  Positive:  Negative:   Physical Exam  BP (!) 135/105 (BP Location: Right Arm)   Pulse 100   Temp 98.4 F (36.9 C) (Oral)   Resp 18   SpO2 100%  Gen:   Awake, no distress   Resp:  Normal effort  MSK:   Moves extremities without difficulty  Other:  Right lower quadrant tenderness to palpation  Medical Decision Making  Medically screening exam initiated at 10:25 PM.  Appropriate orders placed.  SHARAN MCENANEY was informed that the remainder of the evaluation will be completed by another provider, this initial triage assessment does not replace that evaluation, and the importance of remaining in the ED until their evaluation is complete.     Janell Quiet, [place] 03/29/22 2226

## 2022-03-30 MED ORDER — NAPROXEN 500 MG PO TABS: 500.0000 mg | ORAL_TABLET | Freq: Two times a day (BID) | ORAL | 0 refills | Status: AC

## 2022-03-30 MED ORDER — SULFAMETHOXAZOLE-TRIMETHOPRIM 800-160 MG PO TABS
1.0000 | ORAL_TABLET | Freq: Once | ORAL | Status: AC
  Administered 2022-03-30: 1 via ORAL
  Filled 2022-03-30: qty 1

## 2022-03-30 MED ORDER — NAPROXEN 500 MG PO TABS
500.0000 mg | ORAL_TABLET | Freq: Once | ORAL | Status: AC
Start: 2022-03-30 — End: 2022-03-30
  Administered 2022-03-30: 500 mg via ORAL
  Filled 2022-03-30: qty 1

## 2022-03-30 MED ORDER — SULFAMETHOXAZOLE-TRIMETHOPRIM 800-160 MG PO TABS: 1.0000 | ORAL_TABLET | Freq: Two times a day (BID) | ORAL | 0 refills | Status: AC

## 2022-03-30 NOTE — ED Provider Notes (Signed)
[facility]-EMERGENCY DEPT [facility] Emergency Department Provider Note MRN:  [id number]  Arrival date & time: 03/30/22     Chief Complaint   Abdominal Pain   History of Present Illness   Olivia Mcdaniel is a 41 y.o. year-old female with no pertinent past medical history presenting to the ED with chief complaint of abdominal pain.  Suprapubic, right lower quadrant abdominal pain for the past 2 or 3 weeks.  On and off fever up to 102 at home.  No nausea vomiting, no other complaints.  Review of Systems  A thorough review of systems was obtained and all systems are negative except as noted in the HPI and PMH.   Patient's Health History    Past Medical History:  Diagnosis Date   Drug abuse (HCC)    Gallstones    Medical history non-contributory    Ovarian cyst     Past Surgical History:  Procedure Laterality Date   CHOLECYSTECTOMY  06/17/2015   CHOLECYSTECTOMY N/A 06/17/2015   Procedure: LAPAROSCOPIC CHOLECYSTECTOMY;  Surgeon: Emelia Loron, MD;  Location: [facility] OR;  Service: General;  Laterality: N/A;    Family History  Problem Relation Age of Onset   Asthma Mother    Miscarriages / [place] Mother    Learning disabilities Sister    Mental illness Sister    Mental retardation Sister     Social History   Socioeconomic History   Marital status: Single    Spouse name: Not on file   Number of children: Not on file   Years of education: Not on file   Highest education level: Not on file  Occupational History   Not on file  Tobacco Use   Smoking status: Every Day    Packs/day: 0.25    Years: 2.00    Total pack years: 0.50    Types: Cigarettes   Smokeless tobacco: Never   Tobacco comments:    declines  Vaping Use   Vaping Use: Never used  Substance and Sexual Activity   Alcohol use: Not Currently    Alcohol/week: 2.0 standard drinks of alcohol    Types: 2 Glasses of wine per week   Drug use: Not Currently    Types: Cocaine   Sexual activity: Yes     Birth control/protection: Pill  Other Topics Concern   Not on file  Social History Narrative   Not on file   Social Determinants of Health   Financial Resource Strain: Not on file  Food Insecurity: Not on file  Transportation Needs: Not on file  Physical Activity: Not on file  Stress: Not on file  Social Connections: Not on file  Intimate Partner Violence: Not on file     Physical Exam   Vitals:   03/30/22 0052 03/30/22 0155  BP: 108/71 114/71  Pulse: 93 92  Resp: 16 (!) 27  Temp:    SpO2: 100% 100%    CONSTITUTIONAL: Well-appearing, NAD NEURO/PSYCH:  Alert and oriented x 3, no focal deficits EYES:  eyes equal and reactive ENT/NECK:  no LAD, no JVD CARDIO: Regular rate, well-perfused, normal S1 and S2 PULM:  CTAB no wheezing or rhonchi GI/GU:  non-distended, non-tender MSK/SPINE:  No gross deformities, no edema SKIN:  no rash, atraumatic   *Additional and/or pertinent findings included in MDM below  Diagnostic and Interventional Summary    EKG Interpretation  Date/Time:    Ventricular Rate:    PR Interval:    QRS Duration:   QT Interval:    QTC  Calculation:   R Axis:     Text Interpretation:         Labs Reviewed  COMPREHENSIVE METABOLIC PANEL - Abnormal; Notable for the following components:      Result Value   Calcium 8.2 (*)    Albumin 3.1 (*)    AST 11 (*)    Total Bilirubin 0.2 (*)    All other components within normal limits  CBC WITH DIFFERENTIAL/PLATELET - Abnormal; Notable for the following components:   WBC 13.0 (*)    Hemoglobin 9.7 (*)    HCT 32.5 (*)    MCV 79.5 (*)    MCH 23.7 (*)    MCHC 29.8 (*)    RDW 19.8 (*)    Platelets 546 (*)    Neutro Abs 9.5 (*)    All other components within normal limits  URINALYSIS, ROUTINE W REFLEX MICROSCOPIC - Abnormal; Notable for the following components:   Color, Urine STRAW (*)    APPearance HAZY (*)    Specific Gravity, Urine 1.003 (*)    Leukocytes,Ua TRACE (*)    Bacteria, UA MANY (*)     All other components within normal limits  LIPASE, BLOOD  I-STAT BETA HCG BLOOD, ED ([facility], [facility], [facility] ONLY)    CT ABDOMEN PELVIS W CONTRAST  Final Result      Medications  iohexol (OMNIPAQUE) 300 MG/ML solution 100 mL (100 mLs Intravenous Contrast Given 03/29/22 2302)  naproxen (NAPROSYN) tablet 500 mg (500 mg Oral Given 03/30/22 0226)  sulfamethoxazole-trimethoprim (BACTRIM DS) 800-160 MG per tablet 1 tablet (1 tablet Oral Given 03/30/22 0226)     Procedures  /  Critical Care Procedures  ED Course and Medical Decision Making  Initial Impression and Ddx Right lower quadrant pain, DDx includes appendicitis, ovarian cyst, UTI.  No vaginal bleeding or discharge.  Awaiting labs, CT.  Past medical/surgical history that increases complexity of ED encounter: None  Interpretation of Diagnostics I personally reviewed the laboratory assessment and my interpretation is as follows: No significant blood count or electrolyte disturbance.  Urinalysis showing bacteria, some evidence to suggest infection.  CT scan is with normal appendix, unchanged chronic findings.  Patient Reassessment and Ultimate Disposition/Management     Patient is with normal vital signs, sitting comfortably, largely benign abdomen, appropriate for discharge, will treat for possible UTI  Patient management required discussion with the following services or consulting groups:  None  Complexity of Problems Addressed Acute illness or injury that poses threat of life of bodily function  Additional Data Reviewed and Analyzed Further history obtained from: Further history from spouse/family member  Additional Factors Impacting ED Encounter Risk Prescriptions  Elmer Sow. Pilar Plate, MD [facility] Emergency Medicine [facility] [email or web address]  Final Clinical Impressions(s) / ED Diagnoses     ICD-10-CM   1. Urinary tract infection with hematuria, site unspecified  N39.0    R31.9       ED Discharge  Orders          Ordered    sulfamethoxazole-trimethoprim (BACTRIM DS) 800-160 MG tablet  2 times daily        03/30/22 0234    naproxen (NAPROSYN) 500 MG tablet  2 times daily        03/30/22 0234             Discharge Instructions Discussed with and Provided to Patient:    Discharge Instructions      You were evaluated in the Emergency Department and after careful evaluation,  we did not find any emergent condition requiring admission or further testing in the hospital.  Your exam/testing today is overall reassuring.  Symptoms may be due to a urinary tract infection.  Take the Bactrim antibiotic as directed.  Can use the Naprosyn for pain twice daily.  Please return to the Emergency Department if you experience any worsening of your condition.   Thank you for allowing [place] to be a part of your care.      Sabas Sous, MD 03/30/22 [phone]

## 2022-03-30 NOTE — Discharge Instructions (Signed)
You were evaluated in the Emergency Department and after careful evaluation, we did not find any emergent condition requiring admission or further testing in the hospital.  Your exam/testing today is overall reassuring.  Symptoms may be due to a urinary tract infection.  Take the Bactrim antibiotic as directed.  Can use the Naprosyn for pain twice daily.  Please return to the Emergency Department if you experience any worsening of your condition.   Thank you for allowing [place] to be a part of your care.

## 2022-04-21 ENCOUNTER — Ambulatory Visit (HOSPITAL_COMMUNITY)
Admission: EM | Admit: 2022-04-21 | Discharge: 2022-04-21 | Disposition: A | Discharge status: Home / Self Care | Payer: Commercial Managed Care - HMO | Attending: Nurse Practitioner | Admitting: Nurse Practitioner

## 2022-04-21 DIAGNOSIS — F1990 Other psychoactive substance use, unspecified, uncomplicated: Secondary | ICD-10-CM

## 2022-04-21 DIAGNOSIS — F1520 Other stimulant dependence, uncomplicated: Secondary | ICD-10-CM

## 2022-04-21 DIAGNOSIS — Z5900 Homelessness unspecified: Secondary | ICD-10-CM

## 2022-04-21 DIAGNOSIS — F4321 Adjustment disorder with depressed mood: Secondary | ICD-10-CM

## 2022-04-21 DIAGNOSIS — F4323 Adjustment disorder with mixed anxiety and depressed mood: Secondary | ICD-10-CM | POA: Diagnosis not present

## 2022-04-21 MED ORDER — ALUM & MAG HYDROXIDE-SIMETH 200-200-20 MG/5ML PO SUSP: 30.0000 mL | ORAL | Status: DC | PRN

## 2022-04-21 MED ORDER — HYDROXYZINE HCL 25 MG PO TABS: 25.0000 mg | ORAL_TABLET | Freq: Three times a day (TID) | ORAL | Status: DC | PRN

## 2022-04-21 MED ORDER — MAGNESIUM HYDROXIDE 400 MG/5ML PO SUSP: 30.0000 mL | Freq: Every day | ORAL | Status: DC | PRN

## 2022-04-21 MED ORDER — ACETAMINOPHEN 325 MG PO TABS: 650.0000 mg | ORAL_TABLET | Freq: Four times a day (QID) | ORAL | Status: DC | PRN
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
## 2022-04-21 NOTE — ED Notes (Signed)
Medical screen done admission to [facility] offered and refused AMA form signed and provider made aware.

## 2022-04-21 NOTE — ED Triage Notes (Signed)
Pt presents to [facility] voluntarily, accompanied by GPD with complaint of chest pains, depression and grief. Pt reports that she lost her mother this time last year and is still grieving. Pt is currently homeless but lives with boyfriend in a building (his family has allowed him to utilize). Pt reports being homeless for about two years now. Pt has hx of substance use disorder. Pt states" I speak with my dad and sister at times, but really I have no one". Pt reports that she has suicidal ideations at times and it comes and goes, but she has no plan as she has children to live for. Pt denies HI, AVH, substance/alcohol use.

## 2022-04-21 NOTE — ED Provider Notes (Signed)
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 taking: Reported on 01/15/2019 09/29/18   Jacalyn Lefevre, MD  ibuprofen (ADVIL) 600 MG tablet Take 1 tablet (600 mg total) by mouth every 6 (six) hours as needed. Patient not taking: Reported on 03/10/2020 01/15/19   Elpidio Anis, PA-C  naproxen (NAPROSYN) 500 MG tablet Take 1 tablet (500 mg total) by mouth 2 (two) times daily. 03/30/22   Sabas Sous, MD  nitrofurantoin, macrocrystal-monohydrate,  (MACROBID) 100 MG capsule Take 1 capsule (100 mg total) by mouth 2 (two) times daily. 03/27/21   Farrel Gordon, PA-C  ondansetron (ZOFRAN ODT) 4 MG disintegrating tablet Take 1 tablet (4 mg total) by mouth every 8 (eight) hours as needed for nausea or vomiting. 03/27/21   Farrel Gordon, PA-C  polyethylene glycol powder (GLYCOLAX/MIRALAX) 17 GM/SCOOP powder Take 17 g by mouth 2 (two) times daily. Patient not taking: Reported on 03/10/2020 01/17/20   Roxy Horseman, PA-C  promethazine (PHENERGAN) 25 MG tablet Take 1 tablet (25 mg total) by mouth every 6 (six) hours as needed for nausea or vomiting. Patient not taking: Reported on 01/15/2019 09/29/18   Jacalyn Lefevre, MD    Medical Decision Making   Patient initially recommended for continuous observation admission and re-eval in the a.m.  Patient declined admission and is leaving AMA. Patient advised of the benefits of treatment and risk of leaving without treatment, and patient verbalized her understanding. AMA papers signed by patient.   Crisis plan, calling 911, or going to the ED if symptoms worsen.  Discussed follow-up with outpatient psychiatric and therapy services and information provided.  Discussed methods to reduce the risk of self-injury or suicide attempts: Frequent conversations regarding unsafe thoughts. Remove all significant sharps. Remove all firearms. Remove all medications, including over-the-counter meds. Consider lockbox for medications and having a responsible person dispense medications until patient has strengthened coping skills. Room checks for sharps or other harmful objects. Secure all chemical substances that can be ingested or inhaled.   Please refrain from using alcohol or illicit substances, as they can affect your mood and can cause depression, anxiety or other concerning symptoms.  Alcohol can increase the chance that a person will make reckless decisions, like attempting suicide, and can  increase the lethality of a  drug overdose.  Patient verbalized her understanding.    Recommendations  Based on my evaluation the patient does not appear to have an emergency medical condition. Patient left AMA Condition at AMA/Discharge is stable.  Mancel Bale, NP 04/21/22  4:38 AM

## 2022-04-21 NOTE — Discharge Instructions (Signed)

## 2022-10-21 ENCOUNTER — Other Ambulatory Visit: Payer: Self-pay

## 2022-10-21 ENCOUNTER — Emergency Department (HOSPITAL_COMMUNITY)
Admission: EM | Admit: 2022-10-21 | Discharge: 2022-10-21 | Disposition: A | Discharge status: Home / Self Care | Payer: 59 | Attending: Emergency Medicine | Admitting: Emergency Medicine

## 2022-10-21 ENCOUNTER — Encounter (HOSPITAL_COMMUNITY): Payer: Self-pay

## 2022-10-21 DIAGNOSIS — Z1152 Encounter for screening for COVID-19: Secondary | ICD-10-CM | POA: Insufficient documentation

## 2022-10-21 DIAGNOSIS — R519 Headache, unspecified: Secondary | ICD-10-CM | POA: Diagnosis present

## 2022-10-21 DIAGNOSIS — R112 Nausea with vomiting, unspecified: Secondary | ICD-10-CM | POA: Diagnosis not present

## 2022-10-21 LAB — I-STAT BETA HCG BLOOD, ED (MC, WL, AP ONLY): I-stat hCG, quantitative: <5 m[IU]/mL (ref <5)

## 2022-10-21 LAB — BASIC METABOLIC PANEL
Anion gap: 12 (ref 5–15)
BUN: <5 mg/dL — ABNORMAL LOW (ref 6–20)
CO2: 24 mmol/L (ref 22–32)
Calcium: 8.3 mg/dL — ABNORMAL LOW (ref 8.9–10.3)
Chloride: 100 mmol/L (ref 98–111)
Creatinine, Ser: 0.66 mg/dL (ref 0.44–1.00)
GFR, Estimated: >60 mL/min (ref >60)
Glucose, Bld: 117 mg/dL — ABNORMAL HIGH (ref 70–99)
Potassium: 3.1 mmol/L — ABNORMAL LOW (ref 3.5–5.1)
Sodium: 136 mmol/L (ref 135–145)

## 2022-10-21 LAB — CBC WITH DIFFERENTIAL/PLATELET
Abs Immature Granulocytes: 0.05 10*3/uL (ref 0.00–0.07)
Basophils Absolute: 0 10*3/uL (ref 0.0–0.1)
Basophils Relative: 0 %
Eosinophils Absolute: 0 10*3/uL (ref 0.0–0.5)
Eosinophils Relative: 0 %
HCT: 36.8 % (ref 36.0–46.0)
Hemoglobin: 11.7 g/dL — ABNORMAL LOW (ref 12.0–15.0)
Immature Granulocytes: 0 %
Lymphocytes Relative: 7 %
Lymphs Abs: 1 10*3/uL (ref 0.7–4.0)
MCH: 27.5 pg (ref 26.0–34.0)
MCHC: 31.8 g/dL (ref 30.0–36.0)
MCV: 86.4 fL (ref 80.0–100.0)
Monocytes Absolute: 1.7 10*3/uL — ABNORMAL HIGH (ref 0.1–1.0)
Monocytes Relative: 12 %
Neutro Abs: 11.5 10*3/uL — ABNORMAL HIGH (ref 1.7–7.7)
Neutrophils Relative %: 81 %
Platelets: 345 10*3/uL (ref 150–400)
RBC: 4.26 MIL/uL (ref 3.87–5.11)
RDW: 18.1 % — ABNORMAL HIGH (ref 11.5–15.5)
WBC: 14.3 10*3/uL — ABNORMAL HIGH (ref 4.0–10.5)
nRBC: 0 % (ref 0.0–0.2)

## 2022-10-21 LAB — RESP PANEL BY RT-PCR (RSV, FLU A&B, COVID)  RVPGX2
Influenza A by PCR: NEGATIVE
Influenza B by PCR: NEGATIVE
Resp Syncytial Virus by PCR: NEGATIVE
SARS Coronavirus 2 by RT PCR: NEGATIVE

## 2022-10-21 MED ORDER — KETOROLAC TROMETHAMINE 15 MG/ML IJ SOLN
15.0000 mg | Freq: Once | INTRAMUSCULAR | Status: DC
  Filled 2022-10-21: qty 1

## 2022-10-21 MED ORDER — ONDANSETRON HCL 4 MG/2ML IJ SOLN
4.0000 mg | Freq: Once | INTRAMUSCULAR | Status: AC
Start: 2022-10-21 — End: 2022-10-21
  Administered 2022-10-21: 4 mg via INTRAVENOUS
  Filled 2022-10-21: qty 2

## 2022-10-21 MED ORDER — SODIUM CHLORIDE 0.9 % IV BOLUS
1000.0000 mL | Freq: Once | INTRAVENOUS | Status: AC
  Administered 2022-10-21: 1000 mL via INTRAVENOUS

## 2022-10-21 MED ORDER — METOCLOPRAMIDE HCL 5 MG/ML IJ SOLN
10.0000 mg | Freq: Once | INTRAMUSCULAR | Status: AC
  Administered 2022-10-21: 10 mg via INTRAVENOUS
  Filled 2022-10-21: qty 2

## 2022-10-21 MED ORDER — ACETAMINOPHEN 500 MG PO TABS
1000.0000 mg | ORAL_TABLET | Freq: Once | ORAL | Status: AC
  Administered 2022-10-21: 1000 mg via ORAL
  Filled 2022-10-21: qty 2

## 2022-10-21 NOTE — ED Triage Notes (Signed)
BIBA for headache with n/v x3 days  Pt resides at hotel currently and homeless.  Pt screaming on arrival  Denies drug use.  Hx of meth.  [id number]

## 2022-10-21 NOTE — Discharge Instructions (Signed)
You are seen today due to headache.  Take Tylenol and Motrin as needed for pain as well as fever.  I suspect you have a viral process, this should resolve in the next few days.  Return to the ED if you have loss of balance, complete loss of vision, lateralized weakness or numbness, double vision or new conerning symptoms

## 2022-10-21 NOTE — ED Provider Notes (Signed)
[place] AT [facility] Provider Note   CSN: [id number] Arrival date & time: 10/21/22  1355     History  Chief Complaint  Patient presents with   Headache    Olivia Mcdaniel is a 42 y.o. female.   Headache    This is a 42 year old female with history of polysubstance use disorder presenting to the emergency department due to headache, nausea and vomiting.  Started 3 days ago, the headache is constant.  It is "all over my head".  Does not any tendon to the neck, no loss of vision, blurry vision, lateralized weakness or numbness.  She has tried ibuprofen but the pain has been persistent.    Home Medications Prior to Admission medications   Medication Sig Start Date End Date Taking? Authorizing Provider  albuterol (PROVENTIL HFA;VENTOLIN HFA) 108 (90 BASE) MCG/ACT inhaler Inhale 2 puffs into the lungs every 6 (six) hours as needed for wheezing. For shortness of breath Patient not taking: Reported on 11/26/2017 10/15/12   Little Ishikawa, MD  clotrimazole (LOTRIMIN) 1 % cream Apply to affected area 2 times daily for 4 weeks 03/27/21   Alfredia Client, PA-C  docusate sodium (COLACE) 100 MG capsule Take 2 tablets daily until you have a BM, then 1 tablet daily until stool is soft. Patient not taking: Reported on 03/10/2020 01/17/20   Montine Circle, PA-C  HYDROcodone-acetaminophen (NORCO/VICODIN) 5-325 MG tablet Take 1 tablet by mouth every 4 (four) hours as needed. Patient not taking: Reported on 01/15/2019 09/29/18   Isla Pence, MD  ibuprofen (ADVIL) 600 MG tablet Take 1 tablet (600 mg total) by mouth every 6 (six) hours as needed. Patient not taking: Reported on 03/10/2020 01/15/19   Charlann Lange, PA-C  naproxen (NAPROSYN) 500 MG tablet Take 1 tablet (500 mg total) by mouth 2 (two) times daily. 03/30/22   Maudie Flakes, MD  nitrofurantoin, macrocrystal-monohydrate, (MACROBID) 100 MG capsule Take 1 capsule (100 mg total) by mouth 2 (two) times daily. 03/27/21    Alfredia Client, PA-C  ondansetron (ZOFRAN ODT) 4 MG disintegrating tablet Take 1 tablet (4 mg total) by mouth every 8 (eight) hours as needed for nausea or vomiting. 03/27/21   Alfredia Client, PA-C  polyethylene glycol powder (GLYCOLAX/MIRALAX) 17 GM/SCOOP powder Take 17 g by mouth 2 (two) times daily. Patient not taking: Reported on 03/10/2020 01/17/20   Montine Circle, PA-C  promethazine (PHENERGAN) 25 MG tablet Take 1 tablet (25 mg total) by mouth every 6 (six) hours as needed for nausea or vomiting. Patient not taking: Reported on 01/15/2019 09/29/18   Isla Pence, MD      Allergies    Penicillins and Morphine and related    Review of Systems   Review of Systems  Neurological:  Positive for headaches.    Physical Exam Updated Vital Signs BP 104/65 (BP Location: Left Arm)   Pulse 100   Temp 99 F (37.2 C) (Oral)   Resp 16   Wt 49 kg   SpO2 97%   BMI 19.14 kg/m  Physical Exam Vitals and nursing note reviewed. Exam conducted with a chaperone present.  Constitutional:      Appearance: Normal appearance.  HENT:     Head: Normocephalic and atraumatic.     Mouth/Throat:     Mouth: Mucous membranes are dry.  Eyes:     General: No scleral icterus.       Right eye: No discharge.        Left eye: No  discharge.     Extraocular Movements: Extraocular movements intact.     Pupils: Pupils are equal, round, and reactive to light.  Neck:     Comments: No nuchal rigidity Cardiovascular:     Rate and Rhythm: Regular rhythm. Tachycardia present.     Pulses: Normal pulses.     Heart sounds: Normal heart sounds.     No friction rub. No gallop.  Pulmonary:     Effort: Pulmonary effort is normal. No respiratory distress.     Breath sounds: Normal breath sounds.  Abdominal:     General: Abdomen is flat. Bowel sounds are normal. There is no distension.     Palpations: Abdomen is soft.     Tenderness: There is no abdominal tenderness.  Skin:    General: Skin is warm and dry.      Coloration: Skin is not jaundiced.  Neurological:     Mental Status: She is alert. Mental status is at baseline.     Coordination: Coordination normal.     Comments: Cranial nerves II through XII grossly intact, upper and lower extremity strength is symmetric bilaterally.  Able to ambulate across the room     ED Results / Procedures / Treatments   Labs (all labs ordered are listed, but only abnormal results are displayed) Labs Reviewed  BASIC METABOLIC PANEL - Abnormal; Notable for the following components:      Result Value   Potassium 3.1 (*)    Glucose, Bld 117 (*)    BUN <5 (*)    Calcium 8.3 (*)    All other components within normal limits  CBC WITH DIFFERENTIAL/PLATELET - Abnormal; Notable for the following components:   WBC 14.3 (*)    Hemoglobin 11.7 (*)    RDW 18.1 (*)    Neutro Abs 11.5 (*)    Monocytes Absolute 1.7 (*)    All other components within normal limits  RESP PANEL BY RT-PCR (RSV, FLU A&B, COVID)  [id number]  I-STAT BETA HCG BLOOD, ED ([facility], [facility], [facility] ONLY)    EKG None  Radiology No results found.  Procedures Procedures    Medications Ordered in ED Medications  ketorolac (TORADOL) 15 MG/ML injection 15 mg (has no administration in time range)  sodium chloride 0.9 % bolus 1,000 mL (0 mLs Intravenous Stopped 10/21/22 1530)  ondansetron (ZOFRAN) injection 4 mg (4 mg Intravenous Given 10/21/22 1452)  metoCLOPramide (REGLAN) injection 10 mg (10 mg Intravenous Given 10/21/22 1453)  acetaminophen (TYLENOL) tablet 1,000 mg (1,000 mg Oral Given 10/21/22 1514)    ED Course/ Medical Decision Making/ A&P Clinical Course as of 10/21/22 1727  Thu Oct 21, 2022  1635 I reevaluated the patient, heart rate has improved after fluids.  She states she is still having a headache so we will add Toradol given minimal improvement with Reglan. [HS]  [id number] Basic metabolic panel(!) No AKI or gross electrolyte derangement [HS]  1726 CBC with Differential(!) Slight leukocytosis,  mildly anemic with hemoglobin 11.7 [HS]  1726 Resp panel by RT-PCR (RSV, Flu A&B, Covid) Anterior Nasal Swab Negative [HS]  1726 I-Stat beta hCG blood, ED Not a ruptured ectopic pregnancy [HS]    Clinical Course User Index [HS] Sherrill Raring, PA-C                             Medical Decision Making Amount and/or Complexity of Data Reviewed Labs: ordered.  Risk OTC drugs. Prescription drug management.   This  is a 42 year old female with history of polysubstance use disorder presenting to the emergency department due to headache, nausea and vomiting.  Differential includes electrolyte derangement, dehydration, viral etiology of migraine.  I considered meningitis but afebrile, no nuchal rigidity and I have a lower suspicion for meningitis.  She has no appreciable focal deficits on neuroexam, do not think CT head is indicated at this time.  Will check basic labs, fluids and treat with antiemetics and reevaluate.  Do not think this is a or CVA/TIA.  I reevaluated the patient, tolerating p.o. and ambulatory to and from the bathroom with a stable gait.  Headache has improvement. Patient is feeling improved, we discussed the workup and will discharge home with outpatient follow-up.  I considered admission and additional workup given she has no clinical signs of dehydration, electrolyte derangement, acute kidney injury, ectopic pregnancy, sepsis, neurologic deficit I do not think additional workup or hospitalization indicated.  I suspect viral process, discussed return precautions with the patient who verbalized her standing.        Final Clinical Impression(s) / ED Diagnoses Final diagnoses:  Bad headache    Rx / DC Orders ED Discharge Orders     None         Sherrill Raring, PA-C 10/21/22 1727    Tretha Sciara, MD 10/22/22 1623

## 2022-11-05 ENCOUNTER — Other Ambulatory Visit: Payer: Self-pay

## 2022-11-05 ENCOUNTER — Emergency Department (HOSPITAL_COMMUNITY): Payer: 59

## 2022-11-05 ENCOUNTER — Emergency Department (HOSPITAL_COMMUNITY)
Admission: EM | Admit: 2022-11-05 | Discharge: 2022-11-05 | Disposition: A | Discharge status: Home / Self Care | Payer: 59 | Attending: Emergency Medicine | Admitting: Emergency Medicine

## 2022-11-05 ENCOUNTER — Encounter (HOSPITAL_COMMUNITY): Payer: Self-pay

## 2022-11-05 DIAGNOSIS — L03116 Cellulitis of left lower limb: Secondary | ICD-10-CM | POA: Diagnosis not present

## 2022-11-05 DIAGNOSIS — L03119 Cellulitis of unspecified part of limb: Secondary | ICD-10-CM

## 2022-11-05 DIAGNOSIS — R2242 Localized swelling, mass and lump, left lower limb: Secondary | ICD-10-CM | POA: Diagnosis present

## 2022-11-05 MED ORDER — CLINDAMYCIN HCL 150 MG PO CAPS: 450.0000 mg | ORAL_CAPSULE | Freq: Three times a day (TID) | ORAL | 0 refills | Status: AC

## 2022-11-05 NOTE — ED Provider Triage Note (Signed)
Emergency Medicine Provider Triage Evaluation Note  Olivia Mcdaniel , a 42 y.o. female  was evaluated in triage.  Pt complains of left foot and send pain.  Reports that she tripped outside and fell onto the extremity.  This happened almost a week ago.  Pain with ambulation  Review of Systems  Positive:  Negative:   Physical Exam  BP 118/74 (BP Location: Right Arm)   Pulse 79   Temp 98.2 F (36.8 C) (Oral)   Resp 17   Ht '5\' 3"'$  (1.6 m)   Wt 49 kg   SpO2 100%   BMI 19.14 kg/m  Gen:   Awake, no distress   Resp:  Normal effort  MSK:   Moves extremities without difficulty  Other:  Swelling to the dorsal surface of the patient's left foot, scab on the left knee  Medical Decision Making  Medically screening exam initiated at 7:29 PM.  Appropriate orders placed.  NACHOLE HEBRON was informed that the remainder of the evaluation will be completed by another provider, this initial triage assessment does not replace that evaluation, and the importance of remaining in the ED until their evaluation is complete.     Rhae Hammock, [place] 11/05/22 1929

## 2022-11-05 NOTE — ED Provider Notes (Signed)
[place] Provider Note   CSN: [id number] Arrival date & time: 11/05/22  1758     History Chief Complaint  Patient presents with   L foot swelling    Olivia Mcdaniel is a 42 y.o. female reportedly otherwise healthy presents to the ER for evaluation of left redness and pain to her foot.  Patient reports that last week she was walking and inverted her ankle.  She reports that she has been having pain since.  She has had to wear slides as sneakers are hurting her foot more.  She denies any fevers.  She is still ambulatory, however she reports that when she is standing on her feet for longer at times it hurts more.  She is currently homeless.  Denies any IV drug use.  Reports occasional meth use. She denies any fevers or chills.   HPI     Home Medications Prior to Admission medications   Medication Sig Start Date End Date Taking? Authorizing Provider  albuterol (PROVENTIL HFA;VENTOLIN HFA) 108 (90 BASE) MCG/ACT inhaler Inhale 2 puffs into the lungs every 6 (six) hours as needed for wheezing. For shortness of breath Patient not taking: Reported on 11/26/2017 10/15/12   Little Ishikawa, MD  clotrimazole (LOTRIMIN) 1 % cream Apply to affected area 2 times daily for 4 weeks 03/27/21   Alfredia Client, PA-C  docusate sodium (COLACE) 100 MG capsule Take 2 tablets daily until you have a BM, then 1 tablet daily until stool is soft. Patient not taking: Reported on 03/10/2020 01/17/20   Montine Circle, PA-C  HYDROcodone-acetaminophen (NORCO/VICODIN) 5-325 MG tablet Take 1 tablet by mouth every 4 (four) hours as needed. Patient not taking: Reported on 01/15/2019 09/29/18   Isla Pence, MD  ibuprofen (ADVIL) 600 MG tablet Take 1 tablet (600 mg total) by mouth every 6 (six) hours as needed. Patient not taking: Reported on 03/10/2020 01/15/19   Charlann Lange, PA-C  naproxen (NAPROSYN) 500 MG tablet Take 1 tablet (500 mg total) by mouth 2 (two) times daily. 03/30/22    Maudie Flakes, MD  nitrofurantoin, macrocrystal-monohydrate, (MACROBID) 100 MG capsule Take 1 capsule (100 mg total) by mouth 2 (two) times daily. 03/27/21   Alfredia Client, PA-C  ondansetron (ZOFRAN ODT) 4 MG disintegrating tablet Take 1 tablet (4 mg total) by mouth every 8 (eight) hours as needed for nausea or vomiting. 03/27/21   Alfredia Client, PA-C  polyethylene glycol powder (GLYCOLAX/MIRALAX) 17 GM/SCOOP powder Take 17 g by mouth 2 (two) times daily. Patient not taking: Reported on 03/10/2020 01/17/20   Montine Circle, PA-C  promethazine (PHENERGAN) 25 MG tablet Take 1 tablet (25 mg total) by mouth every 6 (six) hours as needed for nausea or vomiting. Patient not taking: Reported on 01/15/2019 09/29/18   Isla Pence, MD      Allergies    Penicillins and Morphine and related    Review of Systems   Review of Systems  Constitutional:  Negative for chills and fever.  Musculoskeletal:  Positive for arthralgias.  Skin:  Positive for color change.    Physical Exam Updated Vital Signs BP 118/74 (BP Location: Right Arm)   Pulse 79   Temp 98.2 F (36.8 C) (Oral)   Resp 17   Ht '5\' 3"'$  (1.6 m)   Wt 49 kg   SpO2 100%   BMI 19.14 kg/m  Physical Exam Vitals and nursing note reviewed.  Constitutional:      General: She is not in  acute distress.    Appearance: Normal appearance. She is not toxic-appearing.  Eyes:     General: No scleral icterus. Pulmonary:     Effort: Pulmonary effort is normal. No respiratory distress.  Musculoskeletal:        General: Tenderness present.     Comments: Erythema to the dorsum of the left foot. No red streaking noted. No wounds seen to the dorsum or plantar aspect of the foot. Palpable DP and PT pulses. Compartments are soft. Sensation intact. The patient has full ROM of the foot with some pain. Cap refill brisk. She is ambulatory.   Skin:    General: Skin is dry.     Findings: No rash.  Neurological:     General: No focal deficit present.      Mental Status: She is alert. Mental status is at baseline.  Psychiatric:        Mood and Affect: Mood normal.     ED Results / Procedures / Treatments   Labs (all labs ordered are listed, but only abnormal results are displayed) Labs Reviewed - No data to display  EKG None  Radiology DG Tibia/Fibula Left  Result Date: 11/05/2022 CLINICAL DATA:  Pain and swelling EXAM: LEFT TIBIA AND FIBULA - 2 VIEW COMPARISON:  None Available. FINDINGS: No fracture or dislocation is seen. There are no opaque foreign bodies. IMPRESSION: No fracture or dislocation is seen in the left tibia and fibula. Electronically Signed   By: Elmer Picker M.D.   On: 11/05/2022 19:21   DG Foot Complete Left  Result Date: 11/05/2022 CLINICAL DATA:  Pain and swelling EXAM: LEFT FOOT - COMPLETE 3+ VIEW COMPARISON:  None Available. FINDINGS: No displaced fracture or dislocation is seen. The soft tissue swelling over the dorsum. There are no opaque foreign bodies. IMPRESSION: No displaced fracture or dislocation is seen in left foot. Electronically Signed   By: Elmer Picker M.D.   On: 11/05/2022 19:20    Procedures Procedures   Medications Ordered in ED Medications - No data to display  ED Course/ Medical Decision Making/ A&P                            Medical Decision Making Risk Prescription drug management.   42 y.o. female presents to the ER for evaluation of left foot pain for the past week. Differential diagnosis includes but is not limited to cellulitis, septic arthritis, contusion, sprain, strain, fracture, dislocation. Vital signs unremarkable. Physical exam as noted above.   X-ray imaging shows no displaced fracture or dislocation is seen in left foot. No fracture or dislocation is seen in the left tibia and fibula.   After consideration of the diagnostic results and the patients response to treatment, I feel that the patient likely has cellulitis. Will treat with antibiotics and a boot.  I  doubt any septic arthritis as patient still has full active range of motion with some pain.  She is still weight bearing as well.  She has palpable pulses. Compartments are soft.  X-rays imaging are unremarkable.  Emergency department workup does not suggest an emergent condition requiring admission or immediate intervention beyond what has been performed at this time.   We discussed the results of the imaging. The plan is outpatient antibiotics and pain control.  We discussed wearing the boot as needed for pain relief.  Discussed Tylenol ibuprofen use.  Discussed the RICE method as well and information given in the discharge  paperwork. We discussed strict return precautions and red flag symptoms. The patient verbalized their understanding and agrees to the plan. The patient is stable and being discharged home in good condition.  Portions of this report may have been transcribed using voice recognition software. Every effort was made to ensure accuracy; however, inadvertent computerized transcription errors may be present.   I discussed this case with my attending physician who cosigned this note including patient's presenting symptoms, physical exam, and planned diagnostics and interventions. Attending physician stated agreement with plan or made changes to plan which were implemented.   Final Clinical Impression(s) / ED Diagnoses Final diagnoses:  Cellulitis of foot    Rx / DC Orders ED Discharge Orders          Ordered    clindamycin (CLEOCIN) 150 MG capsule  3 times daily        11/05/22 2055              Sherrell Puller, PA-C 11/07/22 1447    Davonna Belling, MD 11/07/22 2324

## 2022-11-05 NOTE — Progress Notes (Signed)
Orthopedic Tech Progress Note Patient Details:  Olivia Mcdaniel 23-Jul-1981 [id number]  Ortho Devices Type of Ortho Device: CAM walker Ortho Device/Splint Location: RLE Ortho Device/Splint Interventions: Ordered, Application, Adjustment   Post Interventions Patient Tolerated: Well Instructions Provided: Care of [place] 11/05/2022, 8:52 PM

## 2022-11-05 NOTE — ED Triage Notes (Signed)
Pt arrived POV w/ c/o L foot swelling. She reports she felt a pop while walking. Pt reports rolling her ankle as well. Top of foot red. Pt in NAD

## 2022-11-05 NOTE — Discharge Instructions (Addendum)
You were seen in the ER today for evaluation of your foot pain.  You may have a sprain or strain to your ankle but you do have some overlying infection of the skin called cellulitis.  I included more information on this into the discharge of work as well as the [place].  Please read after and time.  I recommend 60 mg of ibuprofen and or 1000 mg of Tylenol every 6 hours as needed for pain.  You can leave on the boot as needed.  I included information for a sports medicine doctor for you to call discussed on plan with for follow-up.  If you have any concerns, new or worsening symptoms, please return to the nearest emergency department for evaluation.  Contact a doctor if: You have a fever. You do not start to get better after 1-2 days of treatment. Your bone or joint under the infected area starts to hurt after the skin has healed. Your infection comes back in the same area or another area. Signs of this may include: You have a swollen bump in the area. Your red area gets larger, turns dark in color, or hurts more. You have more fluid coming from the wound. Pus or a bad smell develops in your infected area. You have more pain. You feel sick and have muscle aches and weakness. You develop vomiting or watery poop that will not go away. Get help right away if: You see red streaks coming from the area. You notice the skin turns purple or black and falls off. These symptoms may be an emergency. Get help right away. Call 911. Do not wait to see if the symptoms will go away. Do not drive yourself to the hospital.
